# Patient Record
Sex: Male | Born: 1985
Health system: Southern US, Community
[De-identification: ages and names within clinical notes are randomized; demographics above are authoritative.]

## PROBLEM LIST (undated history)

## (undated) DIAGNOSIS — Q249 Congenital malformation of heart, unspecified: Secondary | ICD-10-CM

---
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
## 2020-02-06 ENCOUNTER — Other Ambulatory Visit: Payer: Self-pay

## 2020-02-06 ENCOUNTER — Emergency Department (HOSPITAL_COMMUNITY): Payer: Self-pay

## 2020-02-06 ENCOUNTER — Inpatient Hospital Stay (HOSPITAL_COMMUNITY)
Admission: EM | Admit: 2020-02-06 | Discharge: 2020-02-18 | DRG: 306 | Disposition: A | Discharge status: Home / Self Care | Payer: Self-pay | Attending: Internal Medicine | Admitting: Internal Medicine

## 2020-02-06 ENCOUNTER — Encounter (HOSPITAL_COMMUNITY): Payer: Self-pay | Admitting: Radiology

## 2020-02-06 DIAGNOSIS — R197 Diarrhea, unspecified: Secondary | ICD-10-CM | POA: Diagnosis present

## 2020-02-06 DIAGNOSIS — R748 Abnormal levels of other serum enzymes: Secondary | ICD-10-CM

## 2020-02-06 DIAGNOSIS — R040 Epistaxis: Secondary | ICD-10-CM | POA: Diagnosis not present

## 2020-02-06 DIAGNOSIS — R109 Unspecified abdominal pain: Secondary | ICD-10-CM | POA: Diagnosis present

## 2020-02-06 DIAGNOSIS — E46 Unspecified protein-calorie malnutrition: Secondary | ICD-10-CM | POA: Diagnosis present

## 2020-02-06 DIAGNOSIS — E43 Unspecified severe protein-calorie malnutrition: Secondary | ICD-10-CM | POA: Insufficient documentation

## 2020-02-06 DIAGNOSIS — N179 Acute kidney failure, unspecified: Secondary | ICD-10-CM | POA: Diagnosis present

## 2020-02-06 DIAGNOSIS — I5082 Biventricular heart failure: Secondary | ICD-10-CM | POA: Diagnosis present

## 2020-02-06 DIAGNOSIS — Z7901 Long term (current) use of anticoagulants: Secondary | ICD-10-CM

## 2020-02-06 DIAGNOSIS — M79622 Pain in left upper arm: Secondary | ICD-10-CM | POA: Diagnosis present

## 2020-02-06 DIAGNOSIS — I362 Nonrheumatic tricuspid (valve) stenosis with insufficiency: Secondary | ICD-10-CM

## 2020-02-06 DIAGNOSIS — E871 Hypo-osmolality and hyponatremia: Secondary | ICD-10-CM | POA: Diagnosis not present

## 2020-02-06 DIAGNOSIS — R031 Nonspecific low blood-pressure reading: Secondary | ICD-10-CM | POA: Diagnosis present

## 2020-02-06 DIAGNOSIS — K7200 Acute and subacute hepatic failure without coma: Secondary | ICD-10-CM | POA: Diagnosis present

## 2020-02-06 DIAGNOSIS — I441 Atrioventricular block, second degree: Secondary | ICD-10-CM | POA: Diagnosis present

## 2020-02-06 DIAGNOSIS — R059 Cough, unspecified: Secondary | ICD-10-CM

## 2020-02-06 DIAGNOSIS — R160 Hepatomegaly, not elsewhere classified: Secondary | ICD-10-CM | POA: Diagnosis present

## 2020-02-06 DIAGNOSIS — I4891 Unspecified atrial fibrillation: Secondary | ICD-10-CM | POA: Diagnosis present

## 2020-02-06 DIAGNOSIS — K761 Chronic passive congestion of liver: Secondary | ICD-10-CM | POA: Diagnosis present

## 2020-02-06 DIAGNOSIS — Q225 Ebstein's anomaly: Principal | ICD-10-CM

## 2020-02-06 DIAGNOSIS — Z452 Encounter for adjustment and management of vascular access device: Secondary | ICD-10-CM

## 2020-02-06 DIAGNOSIS — F17200 Nicotine dependence, unspecified, uncomplicated: Secondary | ICD-10-CM | POA: Diagnosis present

## 2020-02-06 DIAGNOSIS — R188 Other ascites: Secondary | ICD-10-CM | POA: Diagnosis present

## 2020-02-06 DIAGNOSIS — Z20822 Contact with and (suspected) exposure to covid-19: Secondary | ICD-10-CM | POA: Diagnosis present

## 2020-02-06 DIAGNOSIS — D649 Anemia, unspecified: Secondary | ICD-10-CM | POA: Diagnosis present

## 2020-02-06 DIAGNOSIS — Z79899 Other long term (current) drug therapy: Secondary | ICD-10-CM

## 2020-02-06 DIAGNOSIS — I50812 Chronic right heart failure: Secondary | ICD-10-CM | POA: Diagnosis present

## 2020-02-06 DIAGNOSIS — I4892 Unspecified atrial flutter: Secondary | ICD-10-CM | POA: Diagnosis present

## 2020-02-06 DIAGNOSIS — I50810 Right heart failure, unspecified: Secondary | ICD-10-CM | POA: Diagnosis present

## 2020-02-06 DIAGNOSIS — F329 Major depressive disorder, single episode, unspecified: Secondary | ICD-10-CM | POA: Diagnosis present

## 2020-02-06 DIAGNOSIS — G4700 Insomnia, unspecified: Secondary | ICD-10-CM | POA: Diagnosis present

## 2020-02-06 DIAGNOSIS — K760 Fatty (change of) liver, not elsewhere classified: Secondary | ICD-10-CM | POA: Diagnosis present

## 2020-02-06 DIAGNOSIS — I50811 Acute right heart failure: Secondary | ICD-10-CM | POA: Diagnosis present

## 2020-02-06 DIAGNOSIS — E162 Hypoglycemia, unspecified: Secondary | ICD-10-CM | POA: Diagnosis not present

## 2020-02-06 DIAGNOSIS — K219 Gastro-esophageal reflux disease without esophagitis: Secondary | ICD-10-CM | POA: Diagnosis present

## 2020-02-06 DIAGNOSIS — I809 Phlebitis and thrombophlebitis of unspecified site: Secondary | ICD-10-CM | POA: Diagnosis present

## 2020-02-06 DIAGNOSIS — D696 Thrombocytopenia, unspecified: Secondary | ICD-10-CM | POA: Diagnosis present

## 2020-02-06 DIAGNOSIS — I471 Supraventricular tachycardia, unspecified: Secondary | ICD-10-CM | POA: Diagnosis present

## 2020-02-06 DIAGNOSIS — E860 Dehydration: Secondary | ICD-10-CM | POA: Diagnosis present

## 2020-02-06 DIAGNOSIS — Z9119 Patient's noncompliance with other medical treatment and regimen: Secondary | ICD-10-CM

## 2020-02-06 DIAGNOSIS — I50813 Acute on chronic right heart failure: Secondary | ICD-10-CM

## 2020-02-06 DIAGNOSIS — R7989 Other specified abnormal findings of blood chemistry: Secondary | ICD-10-CM | POA: Diagnosis present

## 2020-02-06 DIAGNOSIS — R1011 Right upper quadrant pain: Secondary | ICD-10-CM

## 2020-02-06 DIAGNOSIS — E872 Acidosis: Secondary | ICD-10-CM | POA: Diagnosis present

## 2020-02-06 DIAGNOSIS — E876 Hypokalemia: Secondary | ICD-10-CM | POA: Diagnosis present

## 2020-02-06 DIAGNOSIS — K819 Cholecystitis, unspecified: Secondary | ICD-10-CM | POA: Diagnosis present

## 2020-02-06 DIAGNOSIS — I484 Atypical atrial flutter: Secondary | ICD-10-CM

## 2020-02-06 DIAGNOSIS — Z681 Body mass index (BMI) 19 or less, adult: Secondary | ICD-10-CM

## 2020-02-06 DIAGNOSIS — F17210 Nicotine dependence, cigarettes, uncomplicated: Secondary | ICD-10-CM | POA: Diagnosis present

## 2020-02-06 DIAGNOSIS — R570 Cardiogenic shock: Secondary | ICD-10-CM | POA: Diagnosis present

## 2020-02-06 HISTORY — DX: Congenital malformation of heart, unspecified: Q24.9

## 2020-02-06 LAB — COMPREHENSIVE METABOLIC PANEL
ALT: 566 U/L — ABNORMAL HIGH (ref 0–44)
AST: 801 U/L — ABNORMAL HIGH (ref 15–41)
Albumin: 3.7 g/dL (ref 3.5–5.0)
Alkaline Phosphatase: 54 U/L (ref 38–126)
Anion gap: 13 (ref 5–15)
BUN: 12 mg/dL (ref 6–20)
CO2: 24 mmol/L (ref 22–32)
Calcium: 9 mg/dL (ref 8.9–10.3)
Chloride: 96 mmol/L — ABNORMAL LOW (ref 98–111)
Creatinine, Ser: 0.93 mg/dL (ref 0.61–1.24)
GFR calc Af Amer: >60 mL/min (ref >60)
GFR calc non Af Amer: >60 mL/min (ref >60)
Glucose, Bld: 113 mg/dL — ABNORMAL HIGH (ref 70–99)
Potassium: 4.2 mmol/L (ref 3.5–5.1)
Sodium: 133 mmol/L — ABNORMAL LOW (ref 135–145)
Total Bilirubin: 1.7 mg/dL — ABNORMAL HIGH (ref 0.3–1.2)
Total Protein: 7.3 g/dL (ref 6.5–8.1)

## 2020-02-06 LAB — URINALYSIS, ROUTINE W REFLEX MICROSCOPIC
Glucose, UA: NEGATIVE mg/dL
Hgb urine dipstick: NEGATIVE
Ketones, ur: NEGATIVE mg/dL
Leukocytes,Ua: NEGATIVE
Nitrite: NEGATIVE
Protein, ur: 100 mg/dL — AB
Specific Gravity, Urine: 1.01 (ref 1.005–1.030)
pH: 5.5 (ref 5.0–8.0)

## 2020-02-06 LAB — CBC WITH DIFFERENTIAL/PLATELET
Abs Immature Granulocytes: 0.02 10*3/uL (ref 0.00–0.07)
Basophils Absolute: 0 10*3/uL (ref 0.0–0.1)
Basophils Relative: 0 %
Eosinophils Absolute: 0 10*3/uL (ref 0.0–0.5)
Eosinophils Relative: 0 %
HCT: 38.7 % — ABNORMAL LOW (ref 39.0–52.0)
Hemoglobin: 12.7 g/dL — ABNORMAL LOW (ref 13.0–17.0)
Immature Granulocytes: 0 %
Lymphocytes Relative: 14 %
Lymphs Abs: 0.8 10*3/uL (ref 0.7–4.0)
MCH: 29.5 pg (ref 26.0–34.0)
MCHC: 32.8 g/dL (ref 30.0–36.0)
MCV: 90 fL (ref 80.0–100.0)
Monocytes Absolute: 0.8 10*3/uL (ref 0.1–1.0)
Monocytes Relative: 13 %
Neutro Abs: 4.3 10*3/uL (ref 1.7–7.7)
Neutrophils Relative %: 73 %
Platelets: 218 10*3/uL (ref 150–400)
RBC: 4.3 MIL/uL (ref 4.22–5.81)
RDW: 15.8 % — ABNORMAL HIGH (ref 11.5–15.5)
WBC: 5.9 10*3/uL (ref 4.0–10.5)
nRBC: 0 % (ref 0.0–0.2)

## 2020-02-06 LAB — SARS CORONAVIRUS 2 BY RT PCR (HOSPITAL ORDER, PERFORMED IN ~~LOC~~ HOSPITAL LAB): SARS Coronavirus 2: NEGATIVE

## 2020-02-06 LAB — LIPASE, BLOOD: Lipase: 29 U/L (ref 11–51)

## 2020-02-06 LAB — ACETAMINOPHEN LEVEL: Acetaminophen (Tylenol), Serum: <10 ug/mL — ABNORMAL LOW (ref 10–30)

## 2020-02-06 LAB — HEPATITIS PANEL, ACUTE
HCV Ab: NONREACTIVE
Hep A IgM: NONREACTIVE
Hep B C IgM: NONREACTIVE
Hepatitis B Surface Ag: NONREACTIVE

## 2020-02-06 LAB — URINALYSIS, MICROSCOPIC (REFLEX): Bacteria, UA: NONE SEEN

## 2020-02-06 MED ORDER — NICOTINE 14 MG/24HR TD PT24
14.0000 mg | MEDICATED_PATCH | Freq: Every day | TRANSDERMAL | Status: DC
  Administered 2020-02-07 – 2020-02-18 (×12): 14 mg via TRANSDERMAL
  Filled 2020-02-06 (×13): qty 1

## 2020-02-06 MED ORDER — KETOROLAC TROMETHAMINE 30 MG/ML IJ SOLN
30.0000 mg | Freq: Four times a day (QID) | INTRAMUSCULAR | Status: DC
  Administered 2020-02-06 – 2020-02-07 (×3): 30 mg via INTRAVENOUS
  Filled 2020-02-06 (×3): qty 1

## 2020-02-06 MED ORDER — METOPROLOL TARTRATE 5 MG/5ML IV SOLN
2.5000 mg | INTRAVENOUS | Status: DC | PRN
  Administered 2020-02-06 (×8): 2.5 mg via INTRAVENOUS
  Filled 2020-02-06 (×4): qty 5

## 2020-02-06 MED ORDER — LACTATED RINGERS IV SOLN: INTRAVENOUS | Status: DC

## 2020-02-06 MED ORDER — ADENOSINE 6 MG/2ML IV SOLN
INTRAVENOUS | Status: AC
  Administered 2020-02-06: 12 mg
  Filled 2020-02-06: qty 2

## 2020-02-06 MED ORDER — METOPROLOL TARTRATE 5 MG/5ML IV SOLN
5.0000 mg | Freq: Once | INTRAVENOUS | Status: AC
  Administered 2020-02-06: 5 mg via INTRAVENOUS
  Filled 2020-02-06: qty 5

## 2020-02-06 MED ORDER — DIGOXIN 0.25 MG/ML IJ SOLN
0.2500 mg | Freq: Every day | INTRAMUSCULAR | Status: AC
  Administered 2020-02-07: 0.25 mg via INTRAVENOUS
  Filled 2020-02-06: qty 1

## 2020-02-06 MED ORDER — FUROSEMIDE 10 MG/ML IJ SOLN
20.0000 mg | Freq: Two times a day (BID) | INTRAMUSCULAR | Status: DC
  Administered 2020-02-06: 20 mg via INTRAVENOUS
  Filled 2020-02-06: qty 4

## 2020-02-06 MED ORDER — DILTIAZEM HCL-DEXTROSE 125-5 MG/125ML-% IV SOLN (PREMIX)
5.0000 mg/h | INTRAVENOUS | Status: DC
  Filled 2020-02-06: qty 125

## 2020-02-06 MED ORDER — ACETAMINOPHEN 325 MG PO TABS: 650.0000 mg | ORAL_TABLET | Freq: Four times a day (QID) | ORAL | Status: DC | PRN

## 2020-02-06 MED ORDER — METOPROLOL TARTRATE 25 MG PO TABS
12.5000 mg | ORAL_TABLET | Freq: Two times a day (BID) | ORAL | Status: DC
  Administered 2020-02-06 (×2): 12.5 mg via ORAL
  Filled 2020-02-06 (×2): qty 1

## 2020-02-06 MED ORDER — SODIUM CHLORIDE 0.9 % IV SOLN: INTRAVENOUS | Status: DC

## 2020-02-06 MED ORDER — HYDROMORPHONE HCL 1 MG/ML IJ SOLN
1.0000 mg | Freq: Once | INTRAMUSCULAR | Status: AC
  Administered 2020-02-06: 1 mg via INTRAVENOUS
  Filled 2020-02-06: qty 1

## 2020-02-06 MED ORDER — MORPHINE SULFATE (PF) 4 MG/ML IV SOLN: 4.0000 mg | Freq: Once | INTRAVENOUS | Status: DC

## 2020-02-06 MED ORDER — FUROSEMIDE 10 MG/ML IJ SOLN
40.0000 mg | Freq: Once | INTRAMUSCULAR | Status: AC
  Administered 2020-02-06: 40 mg via INTRAVENOUS
  Filled 2020-02-06: qty 4

## 2020-02-06 MED ORDER — ENOXAPARIN SODIUM 40 MG/0.4ML ~~LOC~~ SOLN
40.0000 mg | SUBCUTANEOUS | Status: DC
  Administered 2020-02-06: 40 mg via SUBCUTANEOUS
  Filled 2020-02-06: qty 0.4

## 2020-02-06 MED ORDER — SODIUM CHLORIDE 0.9 % IV BOLUS
1000.0000 mL | Freq: Once | INTRAVENOUS | Status: AC
  Administered 2020-02-06: 1000 mL via INTRAVENOUS

## 2020-02-06 MED ORDER — ONDANSETRON 4 MG PO TBDP
4.0000 mg | ORAL_TABLET | Freq: Four times a day (QID) | ORAL | Status: DC | PRN
  Filled 2020-02-06: qty 1

## 2020-02-06 MED ORDER — MORPHINE SULFATE (PF) 2 MG/ML IV SOLN
2.0000 mg | INTRAVENOUS | Status: DC | PRN
  Administered 2020-02-06 – 2020-02-11 (×2): 2 mg via INTRAVENOUS
  Filled 2020-02-06 (×2): qty 1

## 2020-02-06 MED ORDER — KETOROLAC TROMETHAMINE 30 MG/ML IJ SOLN: 30.0000 mg | Freq: Four times a day (QID) | INTRAMUSCULAR | Status: DC | PRN

## 2020-02-06 MED ORDER — ACETAMINOPHEN 650 MG RE SUPP: 650.0000 mg | Freq: Four times a day (QID) | RECTAL | Status: DC | PRN

## 2020-02-06 MED ORDER — SODIUM CHLORIDE 0.9 % IV SOLN
2.0000 g | INTRAVENOUS | Status: DC
  Administered 2020-02-07: 2 g via INTRAVENOUS
  Filled 2020-02-06: qty 20

## 2020-02-06 MED ORDER — FENTANYL CITRATE (PF) 100 MCG/2ML IJ SOLN
50.0000 ug | Freq: Once | INTRAMUSCULAR | Status: AC
  Administered 2020-02-06: 50 ug via INTRAVENOUS
  Filled 2020-02-06: qty 2

## 2020-02-06 MED ORDER — ONDANSETRON HCL 4 MG/2ML IJ SOLN
4.0000 mg | Freq: Four times a day (QID) | INTRAMUSCULAR | Status: DC | PRN
  Administered 2020-02-07: 4 mg via INTRAVENOUS
  Filled 2020-02-06 (×2): qty 2

## 2020-02-06 MED ORDER — SODIUM CHLORIDE 0.9 % IV SOLN
2.0000 g | Freq: Once | INTRAVENOUS | Status: AC
  Administered 2020-02-06: 2 g via INTRAVENOUS
  Filled 2020-02-06: qty 20

## 2020-02-06 MED ORDER — METOPROLOL TARTRATE 5 MG/5ML IV SOLN
5.0000 mg | Freq: Once | INTRAVENOUS | Status: AC
  Administered 2020-02-06: 2.5 mg via INTRAVENOUS
  Filled 2020-02-06: qty 5

## 2020-02-06 MED ORDER — ADENOSINE 6 MG/2ML IV SOLN
INTRAVENOUS | Status: AC
  Administered 2020-02-06: 6 mg
  Filled 2020-02-06: qty 2

## 2020-02-06 MED ORDER — IOHEXOL 300 MG/ML  SOLN
100.0000 mL | Freq: Once | INTRAMUSCULAR | Status: AC | PRN
  Administered 2020-02-06: 100 mL via INTRAVENOUS

## 2020-02-06 NOTE — ED Provider Notes (Signed)
[facility]-EMERGENCY DEPT Provider Note   CSN: [id number] Arrival date & time: 02/06/20  0944     History Chief Complaint  Patient presents with  . Abdominal Pain  . Emesis  . Tachycardia    Olaf Mesa is a 34 y.o. male.  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  States it started today and has also been associate with some hematuria.  He has had some nausea vomiting.  Denies any fever or chills.  Has had intermittent palpitations.  In the past he had been on digoxin as well as a beta-blocker but does not take any medications currently.  Endorses dizziness but denies any syncope or near syncope.        No past medical history on file.  There are no problems to display for this patient.        No family history on file.  Social History   Tobacco Use  . Smoking status: Not on file  Substance Use Topics  . Alcohol use: Not on file  . Drug use: Not on file    Home Medications Prior to Admission medications   Not on File    Allergies    Patient has no known allergies.  Review of Systems   Review of Systems  All other systems reviewed and are negative.   Physical Exam Updated Vital Signs BP 98/71   Pulse (!) 175   Temp 99.8 F (37.7 C) (Oral)   Resp 17   Ht 1.829 m (6')   Wt 69.5 kg   SpO2 96%   BMI 20.77 kg/m   Physical Exam Vitals and nursing note reviewed.  Constitutional:      General: He is not in acute distress.    Appearance: Normal appearance. He is well-developed. He is not toxic-appearing.  HENT:     Head: Normocephalic and atraumatic.  Eyes:     General: Lids are normal.     Conjunctiva/sclera: Conjunctivae normal.     Pupils: Pupils are equal, round, and reactive to light.  Neck:     Thyroid: No thyroid mass.     Trachea: No tracheal deviation.  Cardiovascular:     Rate and Rhythm: Regular  rhythm. Tachycardia present.     Heart sounds: Normal heart sounds. No murmur heard.  No gallop.   Pulmonary:     Effort: Pulmonary effort is normal. No respiratory distress.     Breath sounds: Normal breath sounds. No stridor. No decreased breath sounds, wheezing, rhonchi or rales.  Abdominal:     General: Bowel sounds are normal. There is no distension.     Palpations: Abdomen is soft.     Tenderness: There is abdominal tenderness in the right upper quadrant. There is guarding. There is no rebound.    Musculoskeletal:        General: No tenderness. Normal range of motion.     Cervical back: Normal range of motion and neck supple.  Skin:    General: Skin is warm and dry.     Findings: No abrasion or rash.  Neurological:     Mental Status: He is alert and oriented to person, place, and time.     GCS: GCS eye subscore is 4. GCS verbal subscore is 5. GCS motor subscore is 6.     Cranial Nerves: No cranial nerve deficit.     Sensory: No sensory deficit.  Psychiatric:  Speech: Speech normal.        Behavior: Behavior normal.     ED Results / Procedures / Treatments   Labs (all labs ordered are listed, but only abnormal results are displayed) Labs Reviewed  URINE CULTURE  SARS CORONAVIRUS 2 BY RT PCR (HOSPITAL ORDER, PERFORMED IN [facility])  CBC WITH DIFFERENTIAL/PLATELET  COMPREHENSIVE METABOLIC PANEL  LIPASE, BLOOD  URINALYSIS, ROUTINE W REFLEX MICROSCOPIC    EKG EKG Interpretation  Date/Time:  Tuesday February 06 2020 10:00:16 EDT Ventricular Rate:  177 PR Interval:    QRS Duration: 106 QT Interval:  313 QTC Calculation: 538 R Axis:   -74 Text Interpretation: Sinus tachycardia Incomplete RBBB and LAFB Anterolateral infarct, age indeterminate Abnormal T, consider ischemia, lateral leads Prolonged QT interval 12 Lead; Mason-Likar No old tracing to compare Confirmed by Lorre Nick ([id number]) on 02/06/2020 10:03:52 AM   Radiology No results  found.  Procedures Procedures (including critical care time)  Medications Ordered in ED Medications  0.9 %  sodium chloride infusion (has no administration in time range)  adenosine (ADENOCARD) 6 MG/2ML injection (6 mg  Given 02/06/20 1018)  sodium chloride 0.9 % bolus 1,000 mL (1,000 mLs Intravenous New Bag/Given 02/06/20 1018)  adenosine (ADENOCARD) 6 MG/2ML injection (12 mg  Given 02/06/20 1027)  adenosine (ADENOCARD) 6 MG/2ML injection (6 mg  Given 02/06/20 1027)    ED Course  I have reviewed the triage vital signs and the nursing notes.  Pertinent labs & imaging results that were available during my care of the patient were reviewed by me and considered in my medical decision making (see chart for details).    MDM Rules/Calculators/A&P                          Patient presented and likely to be SVT and was given adenosine x3 without significant improvement.  This is subsequently followed by Lopressor 5 mg which improved the patient's heart rate.  I did discuss the case with cardiology and they came and saw the patient left consult note.  Patient medicated for his abdominal discomfort.  Labs were significant for a normal white count but elevated transaminases as well as bili.  Patient hepatitis panel sent.  Had a CT scan and subsequent ultrasound which is concerning for a calculus cholecystitis.  Case discussed with general surgery who recommends IV antibiotics and Rocephin was started.  Recommends admission to medicine service and they will order a HIDA scan and follow along.  CRITICAL CARE Performed by: Toy Baker Total critical care time: 75 minutes Critical care time was exclusive of separately billable procedures and treating other patients. Critical care was necessary to treat or prevent imminent or life-threatening deterioration. Critical care was time spent personally by me on the following activities: development of treatment plan with patient and/or surrogate as well as  nursing, discussions with consultants, evaluation of patient's response to treatment, examination of patient, obtaining history from patient or surrogate, ordering and performing treatments and interventions, ordering and review of laboratory studies, ordering and review of radiographic studies, pulse oximetry and re-evaluation of patient's condition.  Final Clinical Impression(s) / ED Diagnoses Final diagnoses:  None    Rx / DC Orders ED Discharge Orders    None       Lorre Nick, MD 02/06/20 [phone]

## 2020-02-06 NOTE — ED Notes (Signed)
Provider notified of Pt returned to tachycardia

## 2020-02-06 NOTE — Consult Note (Addendum)
Cardiology Consultation:   Patient ID: Perry Cook; [id number]; 17-Feb-1986   Admit date: 02/06/2020 Date of Consult: 02/06/2020  Primary Care Provider: No primary care provider on file. Primary Cardiologist: New to [facility]  Patient Profile:   Perry Cook is a 34 y.o. male with a hx of Ebstein's anomaly syndrome  With repair 2011 who is being seen today for the evaluation of SVT at the request of Dr. Zenia Resides.  History of Present Illness:   Mr. Pandit is a 34 year old male with a history stated above who presented to [facility] 02/06/2020 with severe right upper quadrant abdominal pain which began approximately 2 weeks ago however worsened earlier today.  Pain has been associated with nausea, vomiting, dizziness and hematuria. He denies recently illness with fever or chills. He has a hx of Ebstein's anomaly syndrome with repair at age 89 yo.  He reports he was initially diagnosed after collapsing during football practice.  He states he was previously treated for unknown arrhythmia in the postsurgical setting with digoxin and beta-blocker.  He was only on medication for short period of time.  He has not followed with cardiology care in many years.  He denies recent chest pain, palpitations, shortness of breath, LE edema, dizziness or syncope.  He is not currently on any medications.  He continues to have severe abdominal pain during my assessment.  In the ED, EKG performed which showed sinus tachycardia with incomplete bundle with a rate at 177 bpm. He was given three doses of adenosine without conversion at which time he received two doses of metoprolol, both at 2.21m IV with conversion to NSR with rates in the 80's. Plan is for stat CT of his abdomen for further workup given severe pain as well as markedly elevated AST/ALFT levels at 801/566.  BPs are soft however stable at 90/71, 115/74, 102/71.   No past medical history on file.  The histories are not reviewed yet. Please review them  in the "History" navigator section and refresh this [place]   Prior to Admission medications   Not on File    Inpatient Medications: Scheduled Meds:  Continuous Infusions: . sodium chloride 125 mL/hr at 02/06/20 1032   PRN Meds:   Allergies:   No Known Allergies  Social History:   Social History   Socioeconomic History  . Marital status: Single    Spouse name: Not on file  . Number of children: Not on file  . Years of education: Not on file  . Highest education level: Not on file  Occupational History  . Not on file  Tobacco Use  . Smoking status: Not on file  Substance and Sexual Activity  . Alcohol use: Not on file  . Drug use: Not on file  . Sexual activity: Not on file  Other Topics Concern  . Not on file  Social History Narrative  . Not on file   Social Determinants of Health   Financial Resource Strain:   . Difficulty of Paying Living Expenses: Not on file  Food Insecurity:   . Worried About RCharity fundraiserin the Last Year: Not on file  . Ran Out of Food in the Last Year: Not on file  Transportation Needs:   . Lack of Transportation (Medical): Not on file  . Lack of Transportation (Non-Medical): Not on file  Physical Activity:   . Days of Exercise per Week: Not on file  . Minutes of Exercise per Session: Not on file  Stress:   .  Feeling of Stress : Not on file  Social Connections:   . Frequency of Communication with Friends and Family: Not on file  . Frequency of Social Gatherings with Friends and Family: Not on file  . Attends Religious Services: Not on file  . Active Member of Clubs or Organizations: Not on file  . Attends Archivist Meetings: Not on file  . Marital Status: Not on file  Intimate Partner Violence:   . Fear of Current or Ex-Partner: Not on file  . Emotionally Abused: Not on file  . Physically Abused: Not on file  . Sexually Abused: Not on file    Family History:   No family history on file. Family Status:    No family status information on file.    ROS:  Please see the history of present illness.  All other ROS reviewed and negative.     Physical Exam/Data:   Vitals:   02/06/20 1015 02/06/20 1029 02/06/20 1030 02/06/20 1042  BP: 105/79 98/71 102/71 115/74  Pulse: (!) 177 (!) 175 (!) 175   Resp: (!) _0 Temp:      TempSrc:      SpO2: 96% 96% 96%   Weight:      Height:       No intake or output data in the 24 hours ending 02/06/20 1050 Filed Weights   02/06/20 0949  Weight: 69.5 kg   Body mass index is 20.77 kg/m.   General: Ill-appearing, moderate distress Neck: Negative for carotid bruits. No JVD Lungs:Clear to ausculation bilaterally. No wheezes, rales, or rhonchi. Breathing is unlabored. Cardiovascular: RRR with S1 S2. + murmur Abdomen: Soft, tender, non-distended. No obvious abdominal masses. Extremities: No edema. Radial pulses 2+ bilaterally Neuro: Alert and oriented. No focal deficits. No facial asymmetry. MAE spontaneously. Psych: Responds to questions appropriately with normal affect.    EKG:  The EKG was personally reviewed and demonstrates: 02/05/20 sinus tachycardia with ventricular bigeminy at a rate of 123 bpm with evidence of block. Telemetry:  Telemetry was personally reviewed and demonstrates: 02/06/20 NSR with rates in the 80's   Relevant CV Studies:  None   Laboratory Data:  ChemistryNo results for input(s): NA, K, CL, CO2, GLUCOSE, BUN, CREATININE, CALCIUM, GFRNONAA, GFRAA, ANIONGAP in the last 168 hours.  No results found for: PROT, ALBUMIN, AST, ALT, ALKPHOS, BILITOT Hematology Recent Labs  Lab 02/06/20 1025  WBC 5.9  RBC 4.30  HGB 12.7*  HCT 38.7*  MCV 90.0  MCH 29.5  MCHC 32.8  RDW 15.8*  PLT 218   Cardiac EnzymesNo results for input(s): TROPONINI in the last 168 hours. No results for input(s): TROPIPOC in the last 168 hours.  BNPNo results for input(s): BNP, PROBNP in the last 168 hours.  DDimer No results for input(s):  DDIMER in the last 168 hours. TSH: No results found for: TSH Lipids:No results found for: CHOL, HDL, LDLCALC, LDLDIRECT, TRIG, CHOLHDL HgbA1c:No results found for: HGBA1C  Radiology/Studies:  No results found.  Assessment and Plan:   1. 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  Pain has been associated with nausea, vomiting, dizziness and hematuria.  -hx of Ebstein's anomaly syndrome with repair at age 34 yo>>initially diagnosed after collapsing during football practice. He states he was previously treated for unknown arrhythmia in the postsurgical setting with digoxin and beta-blocker.  -Not  followed with cardiology regularly in many years.   -EKG performed which showed sinus tachycardia with incomplete bundle with a rate at 177 bpm>>>given three doses of adenosine without conversion at which time he received two doses of metoprolol, both at 2.5mg IV with conversion to NSR with rates in the 80's.  -Would recommend continuing oral beta blocker with metoprolol tartrate at 12.5mg PO BID given soft BP's. He may be able to tolerate higher doses once acute abdominal illness stabilized.  -Will obtain echocardiogram for further structural assessment  2. Abdominal pain: -Presented with acute abdominal pain which began approximately 2 weeks ago however worsened on day of presentation.  On assessment, he continues to have severe right upper quadrant pain with plans for stat CT for further workup -AST/ALFT markedly elevated at 801/566 with concern for acute cholecystitis with liver involvement Work-up per primary team  For questions or updates, please contact [facility] Please consult [email or web address] for contact info under Cardiology/STEMI.   Signed, Jill McDaniel NP-C [facility] Pager: [phone] 02/06/2020 10:50 AM   I have seen  and examined the patient along with Jill McDaniel NP-C.  I have reviewed the chart, notes and new data.  I agree with PA/NP's note.  Key new complaints: biggest complaints are abdominal - early satiety, nausea and vomiting even with fluids only, dry heaves, epigastric and RUQ and R flank tenderness. No fever or chills or hematemesis. Had pedal edema last week, resolved after he stopped eating. Unaware of palpitations even when HR was >170. Has not had syncope, but a couple of severe "weak spells" with physical activity. No chest discomfort Key examination changes: JVD to angle of jaw, clear lungs, RRR and no murmur (very noisy room though), severe tender hepatomegaly, no leg edema Key new findings / data: Initial ECG shows atrial flutter with 2:1 AV block, RBBB +LAFB; subsequent tracings show irregular rhythm due to varying degrees of AV block. Currently appears to have SR with no evidence of preexcitation (in fact seems to have a very long 1st deg AV block on telemetry); markedly elevated AST/ALT, with normal alk phos and bili and normal WBC; normal renal function. CT shows severe enlargement of right heart chambers, severe hepatomegaly with contrast reflux deep into IVC, c/w R heart failure. Some ascites. Echo pending  PLAN:  He appears to have severe right heart failure with prominent signs and symptoms of hepatic congestion. Suspect that there will be severe abnormalities of the tricuspid valve on echo. 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 He may also have severe TR with faint murmur if there is "wide-open" bidirectional laminar flow. Beta blockers for SVT prevention/rate control. No evidence of WPW so far. Diuresis slowly to avoid hypotension. Awaiting echo for additional recommendations.  Mihai Croitoru, MD, FACC [facility] [phone] 02/06/2020, 1:44 PM    

## 2020-02-06 NOTE — H&P (Signed)
History and Physical    Perry Cook NWG:[id number] DOB: [birth year] DOA: 02/06/2020  PCP: Patient, No Pcp Per Consultants:  None Patient coming from: Home - lives with roommate; NOK: none  Chief Complaint: abdominal pain  HPI: Perry Cook is a 34 y.o. male with medical history significant of Epstein's anomaly presenting with abdominal pain.  He reports that he developed pain in his abdomen and RUQ about 1 month ago.   He has intermittent diarrhea, with 0-3 loose stools per day.  +N/V but more retching and post-tussive heaves than actual emesis since he has been eating very little.  +weight loss for the last month.  +SOB when at work.  LE edema x 1 episode, none currently.  He has noticed his heart racing up to 170 bpm, primarily at work or when he is trying to sleep.   ED Course: ?acalculus cholecystitis.  Developed afib vs. SVT.  Dilt didn't work, beta blocker did.  Cards has seen.  Surgery requests [facility] admission, needs HIDA scan.  Given Rocephin.  Review of Systems: As per HPI; otherwise review of systems reviewed and negative.   Ambulatory Status: Ambulates without assistance  COVID Vaccine Status:  None  Past Medical History:  Diagnosis Date  . Congenital heart disease    Epstein anomaly    Past Surgical History:  Procedure Laterality Date  . Epstein anomaly repair  06/2000    Social History   Socioeconomic History  . Marital status: Single    Spouse name: Not on file  . Number of children: Not on file  . Years of education: Not on file  . Highest education level: Not on file  Occupational History  . Occupation: [employer]  Tobacco Use  . Smoking status: Current Every Day Smoker    Packs/day: 1.00    Start date: 2005  . Smokeless tobacco: Never Used  Substance and Sexual Activity  . 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 Drug use: Not Currently     Comment: denies  . Sexual activity: Not on file  Other Topics Concern  . Not on file  Social History Narrative  . Not on file   Social Determinants of Health   Financial Resource Strain:   . Difficulty of Paying Living Expenses: Not on file  Food Insecurity:   . Worried About Programme researcher, broadcasting/film/video in the Last Year: Not on file  . Ran Out of Food in the Last Year: Not on file  Transportation Needs:   . Lack of Transportation (Medical): Not on file  . Lack of Transportation (Non-Medical): Not on file  Physical Activity:   . Days of Exercise per Week: Not on file  . Minutes of Exercise per Session: Not on file  Stress:   . Feeling of Stress : Not on file  Social Connections:   . Frequency of Communication with Friends and Family: Not on file  . Frequency of Social Gatherings with Friends and Family: Not on file  . Attends Religious Services: Not on file  . Active Member of Clubs or Organizations: Not on file  . Attends Banker Meetings: Not on file  . Marital Status: Not on file  Intimate Partner Violence:   . Fear of Current or Ex-Partner: Not on file  . Emotionally Abused: Not on file  . Physically Abused: Not on file  . Sexually Abused: Not  on file    No Known Allergies  History reviewed. No pertinent family history.  Prior to Admission medications   Medication Sig Start Date End Date Taking? Authorizing Provider  Pseudoeph-Doxylamine-DM-APAP (NYQUIL PO) Take 1 tablet by mouth at bedtime as needed (cold symptoms).   Yes [provider]    Physical Exam: Vitals:   02/06/20 1654 02/06/20 1656 02/06/20 1659 02/06/20 1700  BP:    (!) 130/118  Pulse:  85  88  Resp: 16 17 11  (!) 23  Temp:      TempSrc:      SpO2:  91%  93%  Weight:      Height:         . General:  Appears calm and comfortable and is NAD . Eyes:  PERRL, EOMI, normal lids, iris . ENT:  grossly normal hearing, lips & tongue, mmm; appropriate dentition; large gauges in his  pinna . Neck:  no LAD, masses or thyromegaly . Cardiovascular:  Extreme tachycardia to 170 bpm. No LE edema.  Respiratory:   CTA bilaterally with no wheezes/rales/rhonchi.  Normal respiratory effort. . Abdomen:  soft, RUQ TTP, ND, NABS . Skin:  no rash or induration seen on limited exam . Musculoskeletal:  grossly normal tone BUE/BLE, good ROM, no bony abnormality . Psychiatric:  blunted mood and affect, speech fluent and appropriate, AOx3 . Neurologic:  CN 2-12 grossly intact, moves all extremities in coordinated fashion    Radiological Exams on Admission: DG Chest 2 View  Result Date: 02/06/2020 CLINICAL DATA:  Abdominal pain, tachycardia, congenital heart disease EXAM: CHEST - 2 VIEW COMPARISON:  None. FINDINGS: Lungs are clear. No pneumothorax or pleural effusion. There is moderate global cardiomegaly with particular enlargement of the right atrium. Median sternotomy has been performed. Widening of the right paratracheal stripe may relate to vascular shadow, particularly if there is history of prior shunt formation or inferior vena cava interruption. Lymphadenopathy may appear similarly. The pulmonary vascularity is normal. No acute bone abnormality. IMPRESSION: Moderate global cardiomegaly. Widening of the right paratracheal stripe, vascular shadow versus lymphadenopathy. This could be better assessed with CT imaging if prior examinations are unavailable for comparison. No radiographic evidence of acute cardiopulmonary disease. Electronically Signed   By: 02/08/2020 MD   On: 02/06/2020 15:20   CT Abdomen Pelvis W Contrast  Result Date: 02/06/2020 CLINICAL DATA:  34 year old male with history of abdominal distension. History of Ebstein's anomaly. EXAM: CT ABDOMEN AND PELVIS WITH CONTRAST TECHNIQUE: Multidetector CT imaging of the abdomen and pelvis was performed using the standard protocol following bolus administration of intravenous contrast. CONTRAST:  20 OMNIPAQUE IOHEXOL 300  MG/ML  SOLN COMPARISON:  No priors. FINDINGS: Lower chest: Severe cardiomegaly with massively dilated right atrium. Hepatobiliary: Liver is enlarged measuring up to 19.8 cm in craniocaudal span. Diffuse low attenuation throughout the hepatic parenchyma, indicative of hepatic steatosis. Reflux of contrast material from the inferior vena cava through the hepatic veins, indicative of significant passive hepatic congestion. No discrete cystic or solid hepatic lesions. No intra or extrahepatic biliary ductal dilatation. Gallbladder is unremarkable in appearance. Pancreas: No pancreatic mass. No pancreatic ductal dilatation. No pancreatic or peripancreatic fluid collections or inflammatory changes. Spleen: Unremarkable. Adrenals/Urinary Tract: Bilateral kidneys and adrenal glands are normal in appearance. No hydroureteronephrosis. Urinary bladder is nearly completely decompressed, and otherwise unremarkable in appearance. Stomach/Bowel: Normal appearance of the stomach. No pathologic dilatation of small bowel or colon. Normal appendix. Vascular/Lymphatic: No significant atherosclerotic disease, aneurysm or dissection noted in  the abdominal or pelvic vasculature. No lymphadenopathy noted in the abdomen or pelvis. Reproductive: Prostate gland and seminal vesicles are unremarkable in appearance. Other: Small volume of ascites.  No pneumoperitoneum. Musculoskeletal: There are no aggressive appearing lytic or blastic lesions noted in the visualized portions of the skeleton. IMPRESSION: 1. Cardiomegaly with severe right atrial dilatation and reflux of contrast material into the liver with hepatomegaly, likely secondary to passive hepatic congestion. There is also evidence of hepatic steatosis and a small volume of ascites. 2. No other acute findings are noted in the abdomen or pelvis. Electronically Signed   By: Trudie Reedaniel  Entrikin M.D.   On: 02/06/2020 12:57   US Abdomen Limited  Result Date: 02/06/2020 CLINICAL DATA:  Right  upper quadrant pain EXAM: ULTRASOUND ABDOMEN LIMITED RIGHT UPPER QUADRANT COMPARISON:  CT abdomen and pelvis February 06, 2020 FINDINGS: Gallbladder: No gallstones are appreciable. The gallbladder wall is diffusely thickened and edematous with mild pericholecystic fluid. No sonographic Murphy sign noted by sonographer. Common bile duct: Diameter: 3 mm. Liver: No focal lesion identified. Within normal limits in parenchymal echogenicity. Portal vein is patent on color Doppler imaging with apparent bidirectional flow within the portal vein. Other: There is mild ascites. IMPRESSION: 1. No gallstones evident. Gallbladder wall thickening with edema and pericholecystic fluid. While ascites may cause gallbladder wall thickening, the overall appearance of the gallbladder is concerning for acalculus cholecystitis. In this regard, it may be reasonable to consider nuclear medicine hepatobiliary imaging study to assess for cystic duct patency. 2.  Mild ascites. 3. Bidirectional flow in the portal vein. Portal vein appears patent. Electronically Signed   By: Bretta BangWilliam  Woodruff III M.D.   On: 02/06/2020 14:27    EKG: Independently reviewed.   1000 - Afib vs. SVT with rate 177; prolonged QTc 538 1038 - Afib vs. SVT with rate 169; prolonged QTc 545 1047 - Afib with rate 123; nonspecific ST changes with no evidence of acute ischemia   Labs on Admission: I have personally reviewed the available labs and imaging studies at the time of the admission.  Pertinent labs:   AST 801/ALT 566/Bili 1.7 WBC 5.9 Hgb 12.7 APAP <10 COVID negative   Assessment/Plan Principal Problem:   Right heart failure (HCC) Active Problems:   SVT (supraventricular tachycardia) (HCC)   Abdominal pain   Tobacco dependence   Elevated LFTs   R heart failure -Patient with prior h/o CHD s/p repair presenting with worsening SOB and GI symptoms (see below) -CXR with cardiomegaly but no obvious pulmonary edema -CT with apparent hepatic  congestion and reflux with severe RA dilatation - concerning for R heart failure -Will admit, as per the Emergency HF Mortality Risk Grade.  The patient has: hemodynamic instability and cardiac arrhythmia with recurrent SVT to 170s -Will request echocardiogram -Was given Lasix 40 mg x 1 in ER and will repeat with 20 mg IV BID for gentle diuresis -prn [place] O2 for now -Normal kidney function at this time, will follow -Cardiology is following  SVT -EKGs were not clearly consistent with afib, appears to be more suggestive of SVT -Rate controlled obtained with BB -Will continue PO Lopressor as per cardiology and also add prn IV Lopressor  Abdominal pain with increased LFTs -RUQ US with concern for acute cholecystitis (acalculus) -Patient's primary complaint relates to worsening RUQ/midepigastric pain with retching, inability to take PO, and intermittent diarrhea -Obviously this is concerning for cholecystitis, although he also has liver issues associated with R heart failure, as above -Needs HIDA scan  in AM -Will keep NPO for now -Will cover empirically with Rocephin for now -Morphine for pain, Zofran for nausea -Surgery is following  Tobacco dependence -Encourage cessation.   -This was discussed with the patient and should be reviewed on an ongoing basis.   -Patch ordered at patient request.     Note: This patient has been tested and is negative for the novel coronavirus COVID-19.    DVT prophylaxis: Lovenox  Code Status:  Full - confirmed with patient Family Communication: None present Disposition Plan:  The patient is from: home  Anticipated d/c is to: home, possible with [facility] services  Anticipated d/c date will depend on clinical response to treatment, likely 2-4 days  Patient is currently: acutely ill Consults called: Cardiology; Surgery Admission status: Admit - It is my clinical opinion that admission to INPATIENT is reasonable and necessary because this patient will require  at least 2 midnights in the hospital to treat this condition based on the medical complexity of the problems presented.  Given the aforementioned information, the predictability of an adverse outcome is felt to be significant.     Jonah Blue MD [facility]   How to contact the [facility] Attending or Consulting provider 7A - 7P or covering provider during after hours 7P -7A, for this patient?  1. Check the care team in [facility] and look for a) attending/consulting [facility] provider listed and b) the [facility] team listed 2. Log into [email or web address] and use [employer]'s universal password to access. If you do not have the password, please contact the hospital operator. 3. Locate the [facility] provider you are looking for under [facility] and page to a number that you can be directly reached. 4. If you still have difficulty reaching the provider, please page the [facility] (Director on Call) for the Hospitalists listed on amion for assistance.   02/06/2020, 5:25 PM

## 2020-02-06 NOTE — Consult Note (Addendum)
[facility] Surgery Consult Note  Perry Cook May 05, 1986  [id number].    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  He presented to the ED complain abdominal pain especially on the right side, pain was associated with nausea, vomiting(more dry heaves than actual emesis), dizziness and dark urine. He has had pain almost daily for a month.  He has not been able to eat or drink much.  He continued to work, but is tired and worn out, hard to do his job. He knows he has lost weight, but does not know how much.  Decreased urine output with dark colored urine.    On admission he was tachycardic with heart rate in the 170s.  He was placed in resuscitation room B.  He was seen by cardiology and was shown to be in sinus tachycardia with an incomplete bundle branch block rate of 177.  He was given 3 doses of adenosine without conversion.  He then had 2 doses of metoprolol and converted to a sinus rhythm with a rate in the 80s. Currently his pain has returned and HR is again 150-170's; he is not aware of the elevated heart rate even with [place] telling him his HR is up.   Once his cardiac issues were controlled he underwent a CT of the abdomen the pelvis with contrast.  This shows cardiomegaly with severe right atrial dilatation reflux of contrast material into the liver with hepatomegaly (19.8 cm craniocaudal span,) likely secondary to passive hepatic congestion.  There is also evidence of hepatic steatosis and a small volume of ascites.  Abdominal ultrasound was then obtained this showed no appreciable gallstones.  The gallbladder wall is diffusely thickened and edematous with mild pericholecystic fluid.  Common bile duct was 3 mm, portal vein is patent on Doppler imaging with apparent bidirectional flow within the  portal vein.  There is also mild ascites.  The study was concerning for acalculous cholecystitis. LFT up Alk phos 54, AST 801, ALT 566. T bil 1.7,  Tylenol <10,  WBC 5.9, H/H 12.7/38.7, platelets 218K.  We are asked to see.    ROS: Review of Systems  Constitutional: Positive for weight loss (baseline around 150, he has not been able to eat or drink much for about 1 month.).  Eyes: Negative.   Respiratory: Positive for shortness of breath (DOE over the last month). Negative for cough, hemoptysis, sputum production and wheezing.   Cardiovascular: Positive for palpitations (HR 170's in ED, he is not aware of it.  Even now rate is up and he is not aware of rate elevation.). Negative for orthopnea, claudication, leg swelling and PND (he likes his head up, but no PND or orthopnea).  Gastrointestinal: Positive for abdominal pain (RUQ, anterior, going to right flank and back), diarrhea, nausea (with abdominal pain for a month) and vomiting (more dry heaves after eating and with abdominal pain.). Negative for blood in stool, constipation, heartburn and melena.       He also notes some abdominal swelling that is new to him  Genitourinary:       Decreased urine output and dark colored urine for a few weeks, he's not sure of real timeline.  Musculoskeletal: Negative.   Skin: Negative.   Neurological: Positive for headaches.       No hx of syncope  Endo/Heme/Allergies: Negative.  Psychiatric/Behavioral: Positive for depression. The patient is nervous/anxious.     No family history on file.  History reviewed:   Ebstein anomoly repair 2011 in [place] History:  has no history on file for tobacco use, alcohol use, and drug use.  Allergies: No Known Allergies  Prior to Admission medications   Medication Sig Start Date End Date Taking? Authorizing Provider  Pseudoeph-Doxylamine-DM-APAP (NYQUIL PO) Take 1 tablet by mouth at bedtime as needed (cold symptoms).   Yes [provider]     Blood pressure 110/80, pulse (!) 58, temperature 99.8 F (37.7 C), temperature source Oral, resp. rate 16, height 6' (1.829 m), weight 69.5 kg, SpO2 96 %. Physical Exam:  General: pleasant, ill appearing 34 y/o AA male in no acute distress HEENT: head is normocephalic, atraumatic.  Sclera are noninjected.  Pupils are equal.  Ears and nose without any masses or lesions.  Mouth is pink and moist,  + JVD Heart: regular, rate, 150-170's,   Palpable radial and pedal pulses bilaterally.  I did not hear a murmur, but his HR was up in the 170 range, Echo is pending by Cardiology.  He also had Defib pad and lead for telemetry covering most of his chest. Lungs: CTAB, no wheezes, rhonchi, or rales noted.  Respiratory effort nonlabored  Well healed sternotomy Abd: soft, tender, liver is almost down to his umbilicus,mild distension, BS are hypoactive, no masses, MS: all 4 extremities are symmetrical with no cyanosis, clubbing, or edema. Skin: warm and dry with no masses, lesions, or rashes Neuro: Cranial nerves 2-12 grossly intact, sensation is normal throughout Psych: A&Ox3 with an appropriate affect.   Results for orders placed or performed during the hospital encounter of 02/06/20 (from the past 48 hour(s))  SARS Coronavirus 2 by RT PCR (hospital order, performed in [facility] hospital lab) Nasopharyngeal Nasopharyngeal Swab     Status: None   Collection Time: 02/06/20 10:13 AM   Specimen: Nasopharyngeal Swab  Result Value Ref Range   SARS Coronavirus 2 NEGATIVE NEGATIVE    Comment: (NOTE) SARS-CoV-2 target nucleic acids are NOT DETECTED.  The SARS-CoV-2 RNA is generally detectable in upper and lower respiratory specimens during the acute phase of infection. The lowest concentration of SARS-CoV-2 viral copies this assay can detect is 250 copies / mL. A negative result does not preclude SARS-CoV-2 infection and should not be used as the sole basis for treatment or other patient management  decisions.  A negative result may occur with improper specimen collection / handling, submission of specimen other than nasopharyngeal swab, presence of viral mutation(s) within the areas targeted by this assay, and inadequate number of viral copies (<250 copies / mL). A negative result must be combined with clinical observations, patient history, and epidemiological information.  Fact Sheet for Patients:   StrictlyIdeas.no  Fact Sheet for Healthcare Providers: BankingDealers.co.za  This test is not yet approved or  cleared by the [place] FDA and has been authorized for detection and/or diagnosis of SARS-CoV-2 by FDA under an Emergency Use Authorization (EUA).  This EUA will remain in effect (meaning this test can be used) for the duration of the COVID-19 declaration under Section 564(b)(1) of the Act, 21 U.S.C. section 360bbb-3(b)(1), unless the authorization is terminated or revoked sooner.  Performed at [facility], [place] [address] [place], [place] [address]   CBC with Differential/Platelet     Status: Abnormal   Collection Time: 02/06/20 10:25 AM  Result  Value Ref Range   WBC 5.9 4.0 - 10.5 K/uL   RBC 4.30 4.22 - 5.81 MIL/uL   Hemoglobin 12.7 (L) 13.0 - 17.0 g/dL   HCT 38.7 (L) 39 - 52 %   MCV 90.0 80.0 - 100.0 fL   MCH 29.5 26.0 - 34.0 pg   MCHC 32.8 30.0 - 36.0 g/dL   RDW 15.8 (H) 11.5 - 15.5 %   Platelets 218 150 - 400 K/uL   nRBC 0.0 0.0 - 0.2 %   Neutrophils Relative % 73 %   Neutro Abs 4.3 1.7 - 7.7 K/uL   Lymphocytes Relative 14 %   Lymphs Abs 0.8 0.7 - 4.0 K/uL   Monocytes Relative 13 %   Monocytes Absolute 0.8 0 - 1 K/uL   Eosinophils Relative 0 %   Eosinophils Absolute 0.0 0 - 0 K/uL   Basophils Relative 0 %   Basophils Absolute 0.0 0 - 0 K/uL   Immature Granulocytes 0 %   Abs Immature Granulocytes 0.02 0.00 - 0.07 K/uL    Comment: Performed at [facility], [place]  [address] [place], [place] [address]  Comprehensive metabolic panel     Status: Abnormal   Collection Time: 02/06/20 10:25 AM  Result Value Ref Range   Sodium 133 (L) 135 - 145 mmol/L   Potassium 4.2 3.5 - 5.1 mmol/L   Chloride 96 (L) 98 - 111 mmol/L   CO2 24 22 - 32 mmol/L   Glucose, Bld 113 (H) 70 - 99 mg/dL    Comment: Glucose reference range applies only to samples taken after fasting for at least 8 hours.   BUN 12 6 - 20 mg/dL   Creatinine, Ser 0.93 0.61 - 1.24 mg/dL   Calcium 9.0 8.9 - 10.3 mg/dL   Total Protein 7.3 6.5 - 8.1 g/dL   Albumin 3.7 3.5 - 5.0 g/dL   AST 801 (H) 15 - 41 U/L   ALT 566 (H) 0 - 44 U/L   Alkaline Phosphatase 54 38 - 126 U/L   Total Bilirubin 1.7 (H) 0.3 - 1.2 mg/dL   GFR calc non Af Amer >60 >60 mL/min   GFR calc Af Amer >60 >60 mL/min   Anion gap 13 5 - 15    Comment: Performed at [facility], [place] [address] [place], [place] [address]  Lipase, blood     Status: None   Collection Time: 02/06/20 10:25 AM  Result Value Ref Range   Lipase 29 11 - 51 U/L    Comment: Performed at [facility], [place] [address], [place], [place] [address]  Acetaminophen level     Status: Abnormal   Collection Time: 02/06/20 10:25 AM  Result Value Ref Range   Acetaminophen (Tylenol), Serum <10 (L) 10 - 30 ug/mL    Comment: (NOTE) Therapeutic concentrations vary significantly. A range of 10-30 ug/mL  may be an effective concentration for many patients. However, some  are best treated at concentrations outside of this range. Acetaminophen concentrations >150 ug/mL at 4 hours after ingestion  and >50 ug/mL at 12 hours after ingestion are often associated with  toxic reactions.  Performed at [facility], [place] [address], [place], [place] [address]    DG Chest 2 View  Result Date: 02/06/2020 CLINICAL DATA:  Abdominal pain, tachycardia, congenital heart disease EXAM: CHEST - 2 VIEW COMPARISON:  None. FINDINGS: Lungs  are clear. No pneumothorax or pleural effusion. There is moderate global cardiomegaly with  particular enlargement of the right atrium. Median sternotomy has been performed. Widening of the right paratracheal stripe may relate to vascular shadow, particularly if there is history of prior shunt formation or inferior vena cava interruption. Lymphadenopathy may appear similarly. The pulmonary vascularity is normal. No acute bone abnormality. IMPRESSION: Moderate global cardiomegaly. Widening of the right paratracheal stripe, vascular shadow versus lymphadenopathy. This could be better assessed with CT imaging if prior examinations are unavailable for comparison. No radiographic evidence of acute cardiopulmonary disease. Electronically Signed   By: Fidela Salisbury MD   On: 02/06/2020 15:20   CT Abdomen Pelvis W Contrast  Result Date: 02/06/2020 CLINICAL DATA:  34 year old male with history of abdominal distension. History of Ebstein's anomaly. EXAM: CT ABDOMEN AND PELVIS WITH CONTRAST TECHNIQUE: Multidetector CT imaging of the abdomen and pelvis was performed using the standard protocol following bolus administration of intravenous contrast. CONTRAST:  16m OMNIPAQUE IOHEXOL 300 MG/ML  SOLN COMPARISON:  No priors. FINDINGS: Lower chest: Severe cardiomegaly with massively dilated right atrium. Hepatobiliary: Liver is enlarged measuring up to 19.8 cm in craniocaudal span. Diffuse low attenuation throughout the hepatic parenchyma, indicative of hepatic steatosis. Reflux of contrast material from the inferior vena cava through the hepatic veins, indicative of significant passive hepatic congestion. No discrete cystic or solid hepatic lesions. No intra or extrahepatic biliary ductal dilatation. Gallbladder is unremarkable in appearance. Pancreas: No pancreatic mass. No pancreatic ductal dilatation. No pancreatic or peripancreatic fluid collections or inflammatory changes. Spleen: Unremarkable. Adrenals/Urinary Tract:  Bilateral kidneys and adrenal glands are normal in appearance. No hydroureteronephrosis. Urinary bladder is nearly completely decompressed, and otherwise unremarkable in appearance. Stomach/Bowel: Normal appearance of the stomach. No pathologic dilatation of small bowel or colon. Normal appendix. Vascular/Lymphatic: No significant atherosclerotic disease, aneurysm or dissection noted in the abdominal or pelvic vasculature. No lymphadenopathy noted in the abdomen or pelvis. Reproductive: Prostate gland and seminal vesicles are unremarkable in appearance. Other: Small volume of ascites.  No pneumoperitoneum. Musculoskeletal: There are no aggressive appearing lytic or blastic lesions noted in the visualized portions of the skeleton. IMPRESSION: 1. Cardiomegaly with severe right atrial dilatation and reflux of contrast material into the liver with hepatomegaly, likely secondary to passive hepatic congestion. There is also evidence of hepatic steatosis and a small volume of ascites. 2. No other acute findings are noted in the abdomen or pelvis. Electronically Signed   By: DVinnie LangtonM.D.   On: 02/06/2020 12:57   [place]Abdomen Limited  Result Date: 02/06/2020 CLINICAL DATA:  Right upper quadrant pain EXAM: ULTRASOUND ABDOMEN LIMITED RIGHT UPPER QUADRANT COMPARISON:  CT abdomen and pelvis February 06, 2020 FINDINGS: Gallbladder: No gallstones are appreciable. The gallbladder wall is diffusely thickened and edematous with mild pericholecystic fluid. No sonographic Murphy sign noted by sonographer. Common bile duct: Diameter: 3 mm. Liver: No focal lesion identified. Within normal limits in parenchymal echogenicity. Portal vein is patent on color Doppler imaging with apparent bidirectional flow within the portal vein. Other: There is mild ascites. IMPRESSION: 1. No gallstones evident. Gallbladder wall thickening with edema and pericholecystic fluid. While ascites may cause gallbladder wall thickening, the overall  appearance of the gallbladder is concerning for acalculus cholecystitis. In this regard, it may be reasonable to consider nuclear medicine hepatobiliary imaging study to assess for cystic duct patency. 2.  Mild ascites. 3. Bidirectional flow in the portal vein. Portal vein appears patent. Electronically Signed   By: WLowella GripIII M.D.   On: 02/06/2020 14:27  Assessment/Plan SVT  - HR up to 170 - he does not feel tachycardia Ebstein's anomaly with repair at age 26  - no recent follow up Weight loss Anorexia  Dehydration/malnutrition  Right upper quadrant abdominal pain - nausea, vomiting/dry heaves Possible acalculous cholecystitis vs passive hepatic congestion Ascites   FEN:  NPO/IV fluids ID: Rocephin 9/14 >> day 1 DVT:  Per Medicine/SCD Follow up:  TBD  Plan:  We are ask to see to rule out acalculous cholecystitis vs swelling and edema due to his hepatic congestion.  His symptoms have been ongoing for 1 month as best he can remember, so I don't think this is cholecystitis, but we will order a HIDA and he has been started on Rocephin.   We will follow with you.    Earnstine Regal [facility] 02/06/2020, 3:57 PM Please see Amion for pager number during day hours 7:00am-4:30pm

## 2020-02-06 NOTE — ED Triage Notes (Signed)
Pt arrives today c/o abd pain, especially on the right side.  Pt has hx of congenital heart issues- pt is tachy in the 170s. Pt brought to resus room.

## 2020-02-06 NOTE — ED Provider Notes (Signed)
°  Provider Note MRN:  [id number]  Arrival date & time: 02/06/20    ED Course and Medical Decision Making  Assumed care from Dr. Freida Busman at shift change.  Called to bedside for tachycardia, heart rate ranging between 150 and 170.  Patient has abdominal discomfort and imaging suggestive of possible cholecystitis.  Plan is to admit to hospital service with general surgery following.  Patient was not responsive to adenosine, cardiology has been consulted and patient seems to have SVT with an Ebstein's anomaly status post surgical correction.  They are recommending metoprolol, will provide additional IV dose.  .Critical Care Performed by: Sabas Sous, MD Authorized by: Sabas Sous, MD   Critical care provider statement:    Critical care time (minutes):  32   Critical care was necessary to treat or prevent imminent or life-threatening deterioration of the following conditions: Supraventricular tachycardia.   Critical care was time spent personally by me on the following activities:  Discussions with consultants, evaluation of patient's response to treatment, examination of patient, ordering and performing treatments and interventions, ordering and review of laboratory studies, ordering and review of radiographic studies, pulse oximetry, re-evaluation of patient's condition, obtaining history from patient or surrogate and review of old charts   I assumed direction of critical care for this patient from another provider in my specialty: yes      Final Clinical Impressions(s) / ED Diagnoses     ICD-10-CM   1. Cough  R05 DG Chest 2 View    DG Chest 2 View  2. SVT (supraventricular tachycardia) (HCC)  I47.1     ED Discharge Orders    None      Discharge Instructions   None     Elmer Sow. Pilar Plate, MD [facility] [email or web address]    Sabas Sous, MD 02/06/20 [phone]

## 2020-02-06 NOTE — Progress Notes (Signed)
  Echocardiogram 2D Echocardiogram has been performed.  Perry Cook 02/06/2020, 2:12 PM

## 2020-02-06 NOTE — ED Notes (Signed)
Provider notified of recurring tachycardia

## 2020-02-07 ENCOUNTER — Encounter (HOSPITAL_COMMUNITY): Payer: Self-pay | Admitting: Internal Medicine

## 2020-02-07 ENCOUNTER — Inpatient Hospital Stay (HOSPITAL_COMMUNITY): Payer: Self-pay

## 2020-02-07 DIAGNOSIS — Q228 Other congenital malformations of tricuspid valve: Secondary | ICD-10-CM

## 2020-02-07 DIAGNOSIS — Q249 Congenital malformation of heart, unspecified: Secondary | ICD-10-CM | POA: Insufficient documentation

## 2020-02-07 DIAGNOSIS — R570 Cardiogenic shock: Secondary | ICD-10-CM | POA: Diagnosis present

## 2020-02-07 DIAGNOSIS — I483 Typical atrial flutter: Secondary | ICD-10-CM

## 2020-02-07 LAB — COOXEMETRY PANEL
Carboxyhemoglobin: 0.8 % (ref 0.5–1.5)
Carboxyhemoglobin: 1.1 % (ref 0.5–1.5)
Carboxyhemoglobin: 1.1 % (ref 0.5–1.5)
Methemoglobin: 0.7 % (ref 0.0–1.5)
Methemoglobin: 1.1 % (ref 0.0–1.5)
Methemoglobin: 1.1 % (ref 0.0–1.5)
O2 Saturation: 46.1 %
O2 Saturation: 45.5 %
O2 Saturation: 50 %
Total hemoglobin: 12 g/dL (ref 12.0–16.0)
Total hemoglobin: 12.5 g/dL (ref 12.0–16.0)
Total hemoglobin: 12.2 g/dL (ref 12.0–16.0)

## 2020-02-07 LAB — BASIC METABOLIC PANEL
Anion gap: 19 — ABNORMAL HIGH (ref 5–15)
Anion gap: 13 (ref 5–15)
BUN: 22 mg/dL — ABNORMAL HIGH (ref 6–20)
BUN: 29 mg/dL — ABNORMAL HIGH (ref 6–20)
CO2: 15 mmol/L — ABNORMAL LOW (ref 22–32)
CO2: 20 mmol/L — ABNORMAL LOW (ref 22–32)
Calcium: 8.1 mg/dL — ABNORMAL LOW (ref 8.9–10.3)
Calcium: 8 mg/dL — ABNORMAL LOW (ref 8.9–10.3)
Chloride: 100 mmol/L (ref 98–111)
Chloride: 97 mmol/L — ABNORMAL LOW (ref 98–111)
Creatinine, Ser: 1.82 mg/dL — ABNORMAL HIGH (ref 0.61–1.24)
Creatinine, Ser: 2.28 mg/dL — ABNORMAL HIGH (ref 0.61–1.24)
GFR calc Af Amer: 55 mL/min — ABNORMAL LOW (ref >60)
GFR calc Af Amer: 42 mL/min — ABNORMAL LOW (ref >60)
GFR calc non Af Amer: 47 mL/min — ABNORMAL LOW (ref >60)
GFR calc non Af Amer: 36 mL/min — ABNORMAL LOW (ref >60)
Glucose, Bld: 25 mg/dL — CL (ref 70–99)
Glucose, Bld: 140 mg/dL — ABNORMAL HIGH (ref 70–99)
Potassium: 6 mmol/L — ABNORMAL HIGH (ref 3.5–5.1)
Potassium: 5.1 mmol/L (ref 3.5–5.1)
Sodium: 134 mmol/L — ABNORMAL LOW (ref 135–145)
Sodium: 130 mmol/L — ABNORMAL LOW (ref 135–145)

## 2020-02-07 LAB — COMPREHENSIVE METABOLIC PANEL
ALT: 3023 U/L — ABNORMAL HIGH (ref 0–44)
AST: 8536 U/L — ABNORMAL HIGH (ref 15–41)
Albumin: 2.4 g/dL — ABNORMAL LOW (ref 3.5–5.0)
Alkaline Phosphatase: 43 U/L (ref 38–126)
Anion gap: 12 (ref 5–15)
BUN: 22 mg/dL — ABNORMAL HIGH (ref 6–20)
CO2: 14 mmol/L — ABNORMAL LOW (ref 22–32)
Calcium: 5.9 mg/dL — CL (ref 8.9–10.3)
Chloride: 109 mmol/L (ref 98–111)
Creatinine, Ser: 1.65 mg/dL — ABNORMAL HIGH (ref 0.61–1.24)
GFR calc Af Amer: >60 mL/min (ref >60)
GFR calc non Af Amer: 53 mL/min — ABNORMAL LOW (ref >60)
Glucose, Bld: 91 mg/dL (ref 70–99)
Potassium: 5.4 mmol/L — ABNORMAL HIGH (ref 3.5–5.1)
Sodium: 135 mmol/L (ref 135–145)
Total Bilirubin: 2.1 mg/dL — ABNORMAL HIGH (ref 0.3–1.2)
Total Protein: 5.2 g/dL — ABNORMAL LOW (ref 6.5–8.1)

## 2020-02-07 LAB — HEPATIC FUNCTION PANEL
ALT: 4002 U/L — ABNORMAL HIGH (ref 0–44)
AST: >10000 U/L — ABNORMAL HIGH (ref 15–41)
Albumin: 3.6 g/dL (ref 3.5–5.0)
Alkaline Phosphatase: 60 U/L (ref 38–126)
Bilirubin, Direct: 1.4 mg/dL — ABNORMAL HIGH (ref 0.0–0.2)
Indirect Bilirubin: 1.3 mg/dL — ABNORMAL HIGH (ref 0.3–0.9)
Total Bilirubin: 2.7 mg/dL — ABNORMAL HIGH (ref 0.3–1.2)
Total Protein: 7.4 g/dL (ref 6.5–8.1)

## 2020-02-07 LAB — URINE CULTURE: Culture: NO GROWTH

## 2020-02-07 LAB — CBC
HCT: 36.1 % — ABNORMAL LOW (ref 39.0–52.0)
Hemoglobin: 10.9 g/dL — ABNORMAL LOW (ref 13.0–17.0)
MCH: 29.4 pg (ref 26.0–34.0)
MCHC: 30.2 g/dL (ref 30.0–36.0)
MCV: 97.3 fL (ref 80.0–100.0)
Platelets: 145 10*3/uL — ABNORMAL LOW (ref 150–400)
RBC: 3.71 MIL/uL — ABNORMAL LOW (ref 4.22–5.81)
RDW: 16.4 % — ABNORMAL HIGH (ref 11.5–15.5)
WBC: 14.1 10*3/uL — ABNORMAL HIGH (ref 4.0–10.5)
nRBC: 0.1 % (ref 0.0–0.2)

## 2020-02-07 LAB — CBG MONITORING, ED
Glucose-Capillary: 53 mg/dL — ABNORMAL LOW (ref 70–99)
Glucose-Capillary: 135 mg/dL — ABNORMAL HIGH (ref 70–99)

## 2020-02-07 LAB — GLUCOSE, CAPILLARY
Glucose-Capillary: 102 mg/dL — ABNORMAL HIGH (ref 70–99)
Glucose-Capillary: 107 mg/dL — ABNORMAL HIGH (ref 70–99)

## 2020-02-07 LAB — LACTIC ACID, PLASMA
Lactic Acid, Venous: 7.2 mmol/L (ref 0.5–1.9)
Lactic Acid, Venous: 5.3 mmol/L (ref 0.5–1.9)
Lactic Acid, Venous: 4.7 mmol/L (ref 0.5–1.9)

## 2020-02-07 LAB — RAPID URINE DRUG SCREEN, HOSP PERFORMED
Amphetamines: NOT DETECTED
Barbiturates: NOT DETECTED
Benzodiazepines: NOT DETECTED
Cocaine: NOT DETECTED
Opiates: NOT DETECTED
Tetrahydrocannabinol: NOT DETECTED

## 2020-02-07 LAB — MRSA PCR SCREENING: MRSA by PCR: NEGATIVE

## 2020-02-07 LAB — BRAIN NATRIURETIC PEPTIDE: B Natriuretic Peptide: 862.2 pg/mL — ABNORMAL HIGH (ref 0.0–100.0)

## 2020-02-07 LAB — TSH: TSH: 8.762 u[IU]/mL — ABNORMAL HIGH (ref 0.350–4.500)

## 2020-02-07 LAB — TROPONIN I (HIGH SENSITIVITY): Troponin I (High Sensitivity): 22 ng/L — ABNORMAL HIGH (ref <18)

## 2020-02-07 MED ORDER — DEXTROSE 50 % IV SOLN
1.0000 | Freq: Once | INTRAVENOUS | Status: AC
  Filled 2020-02-07: qty 50

## 2020-02-07 MED ORDER — HEPARIN SODIUM (PORCINE) 5000 UNIT/ML IJ SOLN
5000.0000 [IU] | Freq: Three times a day (TID) | INTRAMUSCULAR | Status: DC
  Administered 2020-02-07: 5000 [IU] via SUBCUTANEOUS
  Filled 2020-02-07: qty 1

## 2020-02-07 MED ORDER — DIGOXIN 125 MCG PO TABS
0.1250 mg | ORAL_TABLET | Freq: Every day | ORAL | Status: DC
  Administered 2020-02-08 – 2020-02-18 (×11): 0.125 mg via ORAL
  Filled 2020-02-07 (×11): qty 1

## 2020-02-07 MED ORDER — CLONAZEPAM 0.5 MG PO TABS: 0.2500 mg | ORAL_TABLET | Freq: Four times a day (QID) | ORAL | Status: DC | PRN

## 2020-02-07 MED ORDER — DEXTROSE 50 % IV SOLN
INTRAVENOUS | Status: AC
  Administered 2020-02-07: 50 mL
  Filled 2020-02-07: qty 50

## 2020-02-07 MED ORDER — METOPROLOL TARTRATE 5 MG/5ML IV SOLN
5.0000 mg | Freq: Four times a day (QID) | INTRAVENOUS | Status: DC
  Administered 2020-02-07: 5 mg via INTRAVENOUS
  Filled 2020-02-07: qty 5

## 2020-02-07 MED ORDER — DEXTROSE 5 % IV SOLN: INTRAVENOUS | Status: DC

## 2020-02-07 MED ORDER — SODIUM ZIRCONIUM CYCLOSILICATE 10 G PO PACK
10.0000 g | PACK | Freq: Once | ORAL | Status: AC
  Administered 2020-02-07: 10 g via ORAL
  Filled 2020-02-07: qty 1

## 2020-02-07 MED ORDER — FUROSEMIDE 10 MG/ML IJ SOLN
40.0000 mg | Freq: Two times a day (BID) | INTRAMUSCULAR | Status: DC
  Administered 2020-02-07: 40 mg via INTRAVENOUS
  Filled 2020-02-07: qty 4

## 2020-02-07 MED ORDER — AMIODARONE HCL IN DEXTROSE 360-4.14 MG/200ML-% IV SOLN
30.0000 mg/h | INTRAVENOUS | Status: DC
  Administered 2020-02-08 – 2020-02-11 (×14): 60 mg/h via INTRAVENOUS
  Administered 2020-02-11 – 2020-02-12 (×2): 30 mg/h via INTRAVENOUS
  Filled 2020-02-07 (×16): qty 200

## 2020-02-07 MED ORDER — METOPROLOL TARTRATE 5 MG/5ML IV SOLN: 2.5000 mg | INTRAVENOUS | Status: DC | PRN

## 2020-02-07 MED ORDER — MILRINONE LACTATE IN DEXTROSE 20-5 MG/100ML-% IV SOLN
0.2500 ug/kg/min | INTRAVENOUS | Status: DC
  Administered 2020-02-07 – 2020-02-09 (×3): 0.25 ug/kg/min via INTRAVENOUS
  Filled 2020-02-07 (×2): qty 100

## 2020-02-07 MED ORDER — AMIODARONE LOAD VIA INFUSION
150.0000 mg | Freq: Once | INTRAVENOUS | Status: DC
  Filled 2020-02-07: qty 83.34

## 2020-02-07 MED ORDER — SODIUM BICARBONATE 8.4 % IV SOLN
50.0000 meq | Freq: Once | INTRAVENOUS | Status: AC
  Administered 2020-02-07: 50 meq via INTRAVENOUS
  Filled 2020-02-07: qty 50

## 2020-02-07 MED ORDER — LIDOCAINE HCL (PF) 1 % IJ SOLN
INTRAMUSCULAR | Status: AC
  Filled 2020-02-07: qty 5

## 2020-02-07 MED ORDER — METOPROLOL TARTRATE 5 MG/5ML IV SOLN: 5.0000 mg | Freq: Four times a day (QID) | INTRAVENOUS | Status: DC | PRN

## 2020-02-07 MED ORDER — AMIODARONE IV BOLUS ONLY 150 MG/100ML
150.0000 mg | Freq: Once | INTRAVENOUS | Status: AC
  Administered 2020-02-07: 150 mg via INTRAVENOUS

## 2020-02-07 MED ORDER — SODIUM CHLORIDE 0.9 % IV SOLN: INTRAVENOUS | Status: DC

## 2020-02-07 MED ORDER — FUROSEMIDE 10 MG/ML IJ SOLN
120.0000 mg | Freq: Once | INTRAVENOUS | Status: AC
  Administered 2020-02-08: 120 mg via INTRAVENOUS
  Filled 2020-02-07: qty 10

## 2020-02-07 MED ORDER — AMIODARONE HCL IN DEXTROSE 360-4.14 MG/200ML-% IV SOLN
INTRAVENOUS | Status: AC
  Administered 2020-02-07: 150 mg via INTRAVENOUS
  Filled 2020-02-07: qty 200

## 2020-02-07 MED ORDER — MILRINONE LACTATE IN DEXTROSE 20-5 MG/100ML-% IV SOLN
0.1250 ug/kg/min | INTRAVENOUS | Status: DC
  Administered 2020-02-07: 0.125 ug/kg/min via INTRAVENOUS
  Filled 2020-02-07: qty 100

## 2020-02-07 MED ORDER — CALCIUM GLUCONATE-NACL 2-0.675 GM/100ML-% IV SOLN
2.0000 g | Freq: Once | INTRAVENOUS | Status: AC
  Administered 2020-02-07: 2000 mg via INTRAVENOUS
  Filled 2020-02-07: qty 100

## 2020-02-07 MED ORDER — DIGOXIN 0.25 MG/ML IJ SOLN
0.2500 mg | INTRAMUSCULAR | Status: AC
  Administered 2020-02-07: 0.25 mg via INTRAVENOUS
  Filled 2020-02-07 (×3): qty 1

## 2020-02-07 MED ORDER — HEPARIN BOLUS VIA INFUSION
3500.0000 [IU] | Freq: Once | INTRAVENOUS | Status: AC
  Administered 2020-02-07: 3500 [IU] via INTRAVENOUS
  Filled 2020-02-07: qty 3500

## 2020-02-07 MED ORDER — DEXTROSE 50 % IV SOLN
1.0000 | Freq: Once | INTRAVENOUS | Status: AC
  Administered 2020-02-07: 50 mL via INTRAVENOUS

## 2020-02-07 MED ORDER — SODIUM CHLORIDE 0.9% FLUSH: 10.0000 mL | Freq: Two times a day (BID) | INTRAVENOUS | Status: DC

## 2020-02-07 MED ORDER — AMIODARONE HCL IN DEXTROSE 360-4.14 MG/200ML-% IV SOLN
60.0000 mg/h | INTRAVENOUS | Status: DC
  Administered 2020-02-07 (×2): 60 mg/h via INTRAVENOUS
  Filled 2020-02-07: qty 200

## 2020-02-07 MED ORDER — SODIUM CHLORIDE 0.9% FLUSH: 10.0000 mL | INTRAVENOUS | Status: DC | PRN

## 2020-02-07 MED ORDER — DIGOXIN 0.25 MG/ML IJ SOLN: 0.1250 mg | Freq: Every day | INTRAMUSCULAR | Status: DC

## 2020-02-07 MED ORDER — AMIODARONE LOAD VIA INFUSION: 150.0000 mg | Freq: Once | INTRAVENOUS | Status: AC

## 2020-02-07 MED ORDER — HEPARIN (PORCINE) 25000 UT/250ML-% IV SOLN
1450.0000 [IU]/h | INTRAVENOUS | Status: DC
  Administered 2020-02-07: 1050 [IU]/h via INTRAVENOUS
  Administered 2020-02-08: 1250 [IU]/h via INTRAVENOUS
  Administered 2020-02-09: 1700 [IU]/h via INTRAVENOUS
  Administered 2020-02-09: 1600 [IU]/h via INTRAVENOUS
  Administered 2020-02-10: 1700 [IU]/h via INTRAVENOUS
  Administered 2020-02-10 – 2020-02-12 (×4): 1650 [IU]/h via INTRAVENOUS
  Administered 2020-02-13: 1600 [IU]/h via INTRAVENOUS
  Filled 2020-02-07 (×10): qty 250

## 2020-02-07 MED ORDER — CLONAZEPAM 0.25 MG PO TBDP: 0.2500 mg | ORAL_TABLET | Freq: Four times a day (QID) | ORAL | Status: DC | PRN

## 2020-02-07 MED ORDER — CHLORHEXIDINE GLUCONATE CLOTH 2 % EX PADS
6.0000 | MEDICATED_PAD | Freq: Every day | CUTANEOUS | Status: DC
  Administered 2020-02-07 – 2020-02-17 (×9): 6 via TOPICAL

## 2020-02-07 MED ORDER — AMIODARONE HCL IN DEXTROSE 360-4.14 MG/200ML-% IV SOLN: 30.0000 mg/h | INTRAVENOUS | Status: DC

## 2020-02-07 NOTE — Progress Notes (Signed)
  Now on milrinone 0.25 and amio 60.   HR down to 110-120s. Co-ox up to 50%.   Cr continues to climb 1.65 -> 2.28  CVP 34.   Lactic acid 7.2 -> 5.3  K stable at 5.1   Remains very tenuous but may be beginning to stabilize. Will give 1 dose lasix 120 IV and assess response.   Additional CCT 35 mins.   Arvilla Meres, MD  11:46 PM

## 2020-02-07 NOTE — Consult Note (Addendum)
NAME:  Perry Cook, MRN:  [id number], DOB:  02-07-1986, LOS: 1 ADMISSION DATE:  02/06/2020, CONSULTATION DATE: 02/07/20 REFERRING MD:  Dr. Rito Ehrlich, CHIEF COMPLAINT:  Abd pain, nausea  Brief History   34 y/o M with history of Ebstein's anomaly s/p repair 2002 presenting at [facility] in SVT complaining of RUQ abdominal pain for the past month.   History of present illness   34 y/o M with PMH of history of Ebstein's anomaly who presented to [facility] ER on 9/14 with RUQ abdominal pain and intermittent palpitations.  He has been experiencing RUQ abdominal pain for about a month.  He has noticed a decrease in his appetite due to nausea and admits to intermittent diarrhea.  He also has noted palpitations when resting.  He works at [employer] and has been experiencing shortness of breath with exertion. Pt reports history of vaping and 1-2 alcoholic drinks per day "but more when out at a show".  He states that he has not vaped or had any alcohol for the past month because he felt so poorly.   In the [facility] ED he was found to be in SVT with a HR of 177 bpm, which converted to NSR briefly after adenosine and metoprolol. Heart rate has been elevated in 140's-150's since 1900 9/14 with transient hypotension with SBP in 80's.  Abdominal CT showed hepatomegaly, indicative of hepatic steatosis, with significant passive hepatic congestion.   Labs significant for K+: 6.0, Glucose: 25~53 (required dextrose IV)~135, AG: 19, BUN: 22, Cr 0.9 which rose to 1.82, AST: > 10,000, ALT: 4,002, Bili: 1.4.    He was initially diagnosed with Ebstein's after collapsing at football practice. 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  Pt denies recent injuries, falls, fevers/chills, weight gain, LE swelling.   Past Medical History  CHD- Ebstein's anomaly syndrome s/p repair in 2002  Significant Hospital Events   9/14 Admit  9/15 PCCM consulted    Consults:    Procedures:     Significant Diagnostic Tests:  9/14 CT ABD / Pelvis >> cardiomegaly with severe right atrial dilatation and reflux of contrast media into the liver with hepatomegaly, hepatic steatosis, small volume ascites 9/14 [place] ABD >> no gallstones, gallbladder wall thickening with edema and pericholecystic fluid, mild ascites, bidirectional flow in the portal vein 9/15 ECHO >>  9/15 LE Doppler >>  9/15 HIDA Scan >>   Micro Data:  COVID 9/14 >> negative  UC 9/14 >>  UA 9/14 >> negative  Antimicrobials:  Ceftriaxone 9/15 >>   Interim history/subjective:  Pt reports abdominal pain and palpitations  Objective   Blood pressure 105/73, pulse (!) 143, temperature 98 F (36.7 C), temperature source Oral, resp. rate 19, height 6' (1.829 m), weight 69.5 kg, SpO2 91 %.        Intake/Output Summary (Last 24 hours) at 02/07/2020 1327 Last data filed at 02/06/2020 1657 Gross per 24 hour  Intake 100 ml  Output --  Net 100 ml   Filed Weights   02/06/20 0949  Weight: 69.5 kg    Examination: General: pleasant, thin young adult male lying on ER stretcher  PSY: quiet, little eye contact HEENT: MM pink/moist, wearing glasses, anicteric  Neuro: AAOx4, speech clear, MAE CV: s1s2 rrr, tachy, heave noted, no m/r/g PULM: non-labored, lungs bilaterally clear  GI: soft, bsx4 active  Extremities: warm/dry, no edema  Skin: no rashes or lesions  PCXR 9/15 >> images personally reviewed, cardiomegaly, no acute infiltrate   Resolved Hospital Problem list      Assessment & Plan:   Cardiogenic Shock in setting of Ebstein's Anomaly s/p Repair  Acute on Chronic Right-sided Heart Failure In the setting of Ebstein's anomaly syndrome s/p repair (2002).  Pt prescribed digoxin and toprol XL with non compliance since 2004. CT showed cardiomegaly and severely dilated right atrium, severe TR and bi-directional flow in hepatic veins. -Digoxin and metoprolol ordered for rate  control -Antiarrythmic medications challenging due to transaminases & AKI, defer to advanced heart failure team  -BNP & troponin ordered, echo pending -IVF discontinued -inotrope/vasopressors as needed to maintain MAP > 65 -appreciate cardiology input  -place central access, follow co-ox, CVP   Atrial Flutter with RVR  -defer antiarrythmics to CHF service  -will need TEE / DCCV -assess TSH  Shock Liver  Abdominal CT showed hepatomegaly (19.8 cm) with findings indicative of hepatic steatosis and significant passive hepatic congestion.  Tylenol level negative.  -trend Lactate, PCT -ceftriaxone continued  -monitor daily liver enzymes -Hida scan ordered > would defer in light of cardiogenic shock.  Suspect findings are related to heart failure   Acute Kidney Injury in the setting of Metabolic Acidosis with Hyperkalemia Lactic Acidosis  -lokelma x1 now  -stat labs now  -Trend BMP / urinary output -Replace electrolytes as indicated -Avoid nephrotoxic agents, ensure adequate renal perfusion  Normocytic Anemia  Thrombocytopenia  -trend CBC -change to heparin for DVT prophylaxis in light of renal failure  -monitor platelets   Hypoglycemia Suspect in setting of hepatic dysfunction with heart failure -D5w at 20 ml/hr  -follow CBG Q4 hours   Hypocalcemia  -2gm calcium now   Best practice:  Diet: Heart healthy as tolerated  Pain/Anxiety/Delirium protocol (if indicated): n/a  VAP protocol (if indicated): n/a  DVT prophylaxis: heparin  GI prophylaxis: n/a  Glucose control: CBG Q4 Mobility: As tolerated  Code Status: Full Code  Family Communication: Patient updated on plan of care  Disposition: ICU, 2H for advanced heart failure   Labs   CBC: Recent Labs  Lab 02/06/20 1025  WBC 5.9  NEUTROABS 4.3  HGB 12.7*  HCT 38.7*  MCV 90.0  PLT 218    Basic Metabolic Panel: Recent Labs  Lab 02/06/20 1025 02/07/20 0531  NA 133* 134*  K 4.2 6.0*  CL 96* 100  CO2 24 15*   GLUCOSE 113* 25*  BUN 12 22*  CREATININE 0.93 1.82*  CALCIUM 9.0 8.1*   GFR: Estimated Creatinine Clearance: 56.2 mL/min (A) (by C-G formula based on SCr of 1.82 mg/dL (H)). Recent Labs  Lab 02/06/20 1025  WBC 5.9    Liver Function Tests: Recent Labs  Lab 02/06/20 1025 02/07/20 0531  AST 801* >10,000*  ALT 566* 4,002*  ALKPHOS 54 60  BILITOT 1.7* 2.7*  PROT 7.3 7.4  ALBUMIN 3.7 3.6   Recent Labs  Lab 02/06/20 1025  LIPASE 29   No results for input(s): AMMONIA in the last 168 hours.  ABG No results found for: PHART, PCO2ART, PO2ART, HCO3, TCO2, ACIDBASEDEF, O2SAT   Coagulation Profile: No results for input(s): INR, PROTIME in the last 168 hours.  Cardiac Enzymes: No results for input(s): CKTOTAL, CKMB, CKMBINDEX, TROPONINI in the last 168 hours.  HbA1C: No results found for: HGBA1C  CBG: Recent Labs  Lab 02/07/20 0744 02/07/20 0840  GLUCAP 53* 135*    Review of Systems: Positives in [place]   Gen: Denies fever, chills,  weight change, fatigue, night sweats HEENT: Denies blurred vision, double vision, hearing loss, tinnitus, sinus congestion, rhinorrhea, sore throat, neck stiffness, dysphagia PULM: Denies shortness of breath, cough, sputum production, hemoptysis, wheezing CV: Denies chest pain, edema, orthopnea, paroxysmal nocturnal dyspnea, palpitations GI: Denies abdominal pain, nausea, vomiting, diarrhea, hematochezia, melena, constipation, change in bowel habits GU: Denies dysuria, hematuria, polyuria, oliguria, urethral discharge Endocrine: Denies hot or cold intolerance, polyuria, polyphagia or appetite change Derm: Denies rash, dry skin, scaling or peeling skin change Heme: Denies easy bruising, bleeding, bleeding gums Neuro: Denies headache, numbness, weakness, slurred speech, loss of memory or consciousness  Past Medical History  He,  has a past medical history of Congenital heart disease.   Surgical History    Past Surgical History:   Procedure Laterality Date  . Epstein anomaly repair  06/2000     Social History   reports that he has been smoking. He started smoking about 16 years ago. He has been smoking about 1.00 pack per day. He has never used smokeless tobacco. He reports current alcohol use. He reports previous drug use.   Family History   His family history is not on file.   Allergies No Known Allergies   Home Medications  Prior to Admission medications   Not on File     Critical care time: 40 minutes      Canary Brim, MSN, NP-C [facility] 02/07/2020, 1:27 PM   Please see [email or web address] for pager details.

## 2020-02-07 NOTE — ED Notes (Signed)
Pt given OJ 

## 2020-02-07 NOTE — Procedures (Signed)
Central Venous Catheter Insertion Procedure Note  Ameya Kutz  [id number]  08/10/1985  Date:02/07/20  Time:6:30 PM   Provider Performing:Poseidon Pam A Leone Mobley   Procedure: Insertion of Non-tunneled Central Venous [phone]) with US guidance (33825)   Indication(s) Medication administration  Consent Risks of the procedure as well as the alternatives and risks of each were explained to the patient and/or caregiver.  Consent for the procedure was obtained and is signed in the bedside chart  Anesthesia Topical only with 1% lidocaine   Timeout Verified patient identification, verified procedure, site/side was marked, verified correct patient position, special equipment/implants available, medications/allergies/relevant history reviewed, required imaging and test results available.  Sterile Technique Maximal sterile technique including full sterile barrier drape, hand hygiene, sterile gown, sterile gloves, mask, hair covering, sterile ultrasound probe cover (if used).  Procedure Description Area of catheter insertion was cleaned with chlorhexidine and draped in sterile fashion.  With real-time ultrasound guidance a central venous catheter was placed into the right internal jugular vein. Nonpulsatile blood flow and easy flushing noted in all ports.  The catheter was sutured in place and sterile dressing applied.  Complications/Tolerance None; patient tolerated the procedure well. Chest X-ray is ordered to verify placement for internal jugular or subclavian cannulation.   Chest x-ray is not ordered for femoral cannulation.  EBL Minimal  Specimen(s) None

## 2020-02-07 NOTE — Progress Notes (Addendum)
Advanced Heart Failure Team Consult Note   Primary Physician: Patient, No Pcp Per PCP-Cardiologist:  Perry Fair, MD  Reason for Consultation: Cardiogenic shock  HPI:     Perry Cook is a 34 year old male with a hx of Ebstein's anomaly syndrome s/p repair 2011 whom we are asked to see by Dr. Addison Lank for cardiogenic shock  He has a hx of Ebstein's anomaly syndrome with repair at age 89 yo.  He reports he was initially diagnosed after collapsing during football practice.  He states he was previously treated for unknown arrhythmia in the postsurgical setting with digoxin and beta-blocker.  He was only on medication for short period of time.  He has not followed with cardiology care in many years.  Says he began having nausea an ab pain about 2 weeks ago. He denies recently illness with fever or chills.  In ED, EKG showed AFL with rate at 177 bpm. He was given three doses of adenosine without conversion at which time he received two doses of metoprolol, both at 2.5mg  IV with conversion to NSR with rates in the 80's. He maintained NSR with marked 1AVB throughout the morning today and then developed recurrent AFL with cardiogenic shock with LFTs > 10k and lactate 7.2. Creatinine 0.9 -> 1.6.  Dr. Addison Lank discussed anti-arrhythmic therapy with EP and not felt to be good candidate for any agent amio (shock liver), flecainide (RV failure), dofetilide (AKI).   He is relatively asymptomatic aside from some mild nausea. Denies CP or SOB.  Review of Systems: [y] = yes, [ ]  = no   . General: Weight gain [ ] ; Weight loss [ ] ; Anorexia [ y]; Fatigue [ y]; Fever [ ] ; Chills [ ] ; Weakness ]  . Cardiac: Chest pain/pressure [ ] ; Resting SOB [ ] ; Exertional SOB [ ] ; Orthopnea [ ] ; Pedal Edema [ ] ; Palpitations [ ] ; Syncope [ ] ; Presyncope [ ] ; Paroxysmal nocturnal dyspnea[ ]   . Pulmonary: Cough [ ] ; Wheezing[ ] ; Hemoptysis[ ] ; Sputum [ ] ; Snoring [ ]   . GI: Vomiting[y ]; Dysphagia[ ] ; Melena[  ]; Hematochezia [ ] ; Heartburn[ ] ; Abdominal pain [ y]; Constipation [ ] ; Diarrhea [ ] ; BRBPR [ ]   . GU: Hematuria[ ] ; Dysuria [ ] ; Nocturia[ ]   . Vascular: Pain in legs with walking [ ] ; Pain in feet with lying flat [ ] ; Non-healing sores [ ] ; Stroke [ ] ; TIA [ ] ; Slurred speech [ ] ;  . Neuro: Headaches[ ] ; Vertigo[ ] ; Seizures[ ] ; Paresthesias[ ] ;Blurred vision [ ] ; Diplopia [ ] ; Vision changes [ ]   . Ortho/Skin: Arthritis [ ] ; Joint pain [ ] ; Muscle pain [ ] ; Joint swelling [ ] ; Back Pain [ ] ; Rash [ ]   . Psych: Depression[ ] ; Anxiety[ ]   . Heme: Bleeding problems [ ] ; Clotting disorders [ ] ; Anemia [ ]   . Endocrine: Diabetes [ ] ; Thyroid dysfunction[ ]   Home Medications Prior to Admission medications   Not on File    Past Medical History: Past Medical History:  Diagnosis Date  . Congenital heart disease    Epstein anomaly s/p repair 2002    Past Surgical History: Past Surgical History:  Procedure Laterality Date  . Epstein anomaly repair  06/2000    Family History: History reviewed. No pertinent family history.  Social History: Social History   Socioeconomic History  . Marital status: Single    Spouse name: Not on file  . Number of children: Not on file  . Years of education: Not  on file  . Highest education level: Not on file  Occupational History  . Occupation: [employer]  Tobacco Use  . Smoking status: Current Every Day Smoker    Packs/day: 1.00    Start date: 2005  . Smokeless tobacco: Never Used  Substance and Sexual Activity  . 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 Drug use: Not Currently    Comment: denies  . Sexual activity: Not on file  Other Topics Concern  . Not on file  Social History Narrative  . Not on file   Social Determinants of Health   Financial Resource Strain:   . Difficulty of Paying Living Expenses: Not on file  Food Insecurity:   .  Worried About Programme researcher, broadcasting/film/video in the Last Year: Not on file  . Ran Out of Food in the Last Year: Not on file  Transportation Needs:   . Lack of Transportation (Medical): Not on file  . Lack of Transportation (Non-Medical): Not on file  Physical Activity:   . Days of Exercise per Week: Not on file  . Minutes of Exercise per Session: Not on file  Stress:   . Feeling of Stress : Not on file  Social Connections:   . Frequency of Communication with Friends and Family: Not on file  . Frequency of Social Gatherings with Friends and Family: Not on file  . Attends Religious Services: Not on file  . Active Member of Clubs or Organizations: Not on file  . Attends Banker Meetings: Not on file  . Marital Status: Not on file    Allergies:  No Known Allergies  Objective:    Vital Signs:   Temp:  [97.5 F (36.4 C)-98 F (36.7 C)] 98 F (36.7 C) (09/15 0527) Pulse Rate:  [20-174] 143 (09/15 1300) Resp:  [11-29] 19 (09/15 1300) BP: (85-130)/(25-118) 105/73 (09/15 1300) SpO2:  [91 %-100 %] 91 % (09/15 1300)    Weight change: Filed Weights   02/06/20 0949  Weight: 69.5 kg    Intake/Output:   Intake/Output Summary (Last 24 hours) at 02/07/2020 1621 Last data filed at 02/06/2020 1657 Gross per 24 hour  Intake 100 ml  Output --  Net 100 ml      Physical Exam    General: Sitting up in bed o resp difficulty HEENT: normal Neck: supple. JVP to ear. Carotids 2+ bilat; no bruits. No lymphadenopathy or thyromegaly appreciated. Cor: PMI nondisplaced. Regular tachy + TR Lungs: clear Abdomen: soft, + RUQ tender, + distended. + pulsatile liver. No bruits or masses. Good bowel sounds. Extremities: no cyanosis, clubbing, rash, edema + cool Neuro: alert & orientedx3, cranial nerves grossly intact. moves all 4 extremities w/o difficulty. Affect pleasant   Telemetry   Atrial flutter  EKG    Atrial flutter 160 Personally reviewed  Labs   Basic Metabolic Panel: Recent  Labs  Lab 02/06/20 1025 02/07/20 0531 02/07/20 1333  NA 133* 134* 135  K 4.2 6.0* 5.4*  CL 96* 100 109  CO2 24 15* 14*  GLUCOSE 113* 25* 91  BUN 12 22* 22*  CREATININE 0.93 1.82* 1.65*  CALCIUM 9.0 8.1* 5.9*    Liver Function Tests: Recent Labs  Lab 02/06/20 1025 02/07/20 0531 02/07/20 1333  AST 801* >10,000* PENDING  ALT 566* 4,002* PENDING  ALKPHOS 54 60 43  BILITOT 1.7* 2.7* 2.1*  PROT 7.3  7.4 5.2*  ALBUMIN 3.7 3.6 2.4*   Recent Labs  Lab 02/06/20 1025  LIPASE 29   No results for input(s): AMMONIA in the last 168 hours.  CBC: Recent Labs  Lab 02/06/20 1025 02/07/20 1333  WBC 5.9 14.1*  NEUTROABS 4.3  --   HGB 12.7* 10.9*  HCT 38.7* 36.1*  MCV 90.0 97.3  PLT 218 145*    Cardiac Enzymes: No results for input(s): CKTOTAL, CKMB, CKMBINDEX, TROPONINI in the last 168 hours.  BNP: BNP (last 3 results) No results for input(s): BNP in the last 8760 hours.  ProBNP (last 3 results) No results for input(s): PROBNP in the last 8760 hours.   CBG: Recent Labs  Lab 02/07/20 0744 02/07/20 0840  GLUCAP 53* 135*    Coagulation Studies: No results for input(s): LABPROT, INR in the last 72 hours.   Imaging    No results found.   Medications:     Current Medications: . digoxin  0.25 mg Intravenous Q2H   Followed by  . [START ON 02/08/2020] digoxin  0.125 mg Intravenous Daily  . heparin injection (subcutaneous)  5,000 Units Subcutaneous Q8H  . metoprolol tartrate  5 mg Intravenous Q6H  . nicotine  14 mg Transdermal Daily  . sodium bicarbonate  50 mEq Intravenous Once     Infusions: . cefTRIAXone (ROCEPHIN)  IV 2 g (02/07/20 1543)  . dextrose          Assessment/Plan   1. 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 Ebstein anomaly s/p repair with severe TR and to-fro flow in hepatic veins - baseline RV function  and degree of TR unclear suspect decompensation related to AFL  - now with shock liver and kidney - despite severe transaminitis will need IV amio to help control rhythm - start milrinone for shock - place central access to monitor co-ox and CVP   2. Atrial flutter with RVR - start IV amio and heparin - eventual TEE/DC-CV (if not more emergent) - Dr. Addison Lank has discussed with EP and seems to be 1:1 RA circuit - will eventually need TEE/DC-CV (vs ablation)  if does not convert with amio   3. AKI - due to shock - will support hemodynamics  4. Shock liver - due to cardiogenic shock hepatitis panels negative  5. Hyperkalemia - lokelma - treat shock  CRITICAL CARE Performed by: Arvilla Meres  Total critical care time: 60 minutes  Critical care time was exclusive of separately billable procedures and treating other patients.  Critical care was necessary to treat or prevent imminent or life-threatening deterioration.  Critical care was time spent personally by me (independent of midlevel providers or residents) on the following activities: development of treatment plan with patient and/or surrogate as well as nursing, discussions with consultants, evaluation of patient's response to treatment, examination of patient, obtaining history from patient or surrogate, ordering and performing treatments and interventions, ordering and review of laboratory studies, ordering and review of radiographic studies, pulse oximetry and re-evaluation of patient's condition.    Length of Stay: 1  Arvilla Meres, MD  02/07/2020, 4:21 PM  Advanced Heart Failure Team Pager [phone] (M-F; 7a - 4p)  Please contact [facility] for night-coverage after hours (4p -7a ) and weekends on [email or web address]

## 2020-02-07 NOTE — Progress Notes (Addendum)
Subjective: CC: Patient with tachycardia on the monitor in the 150's.  He complains of generalized abdominal pain, worse in the epigastrium and ruq. Had some vomiting a couple hours ago. No nausea.  Denies chest pain, sob.   Objective: Vital signs in last 24 hours: Temp:  [97.5 F (36.4 C)-98 F (36.7 C)] 98 F (36.7 C) (09/15 0527) Pulse Rate:  [20-174] 143 (09/15 1300) Resp:  [11-29] 19 (09/15 1300) BP: (85-130)/(25-118) 105/73 (09/15 1300) SpO2:  [91 %-100 %] 91 % (09/15 1300)    Intake/Output from previous day: 09/14 0701 - 09/15 0700 In: 1100 [IV Piggyback:1100] Out: -  Intake/Output this shift: No intake/output data recorded.  PE: Gen:  Alert, NAD, pleasant HEENT: EOM's intact, pupils equal and round Card: Tachycardia in the 150's Pulm:  CTAB, no W/R/R, effort normal Abd: Soft, ND, generalized tenderness greatest in the LLQ and RUQ, +BS Ext:  No LE edema  Psych: A&Ox3  Skin: no rashes noted, warm and dry   Lab Results:  Recent Labs    02/06/20 1025  WBC 5.9  HGB 12.7*  HCT 38.7*  PLT 218   BMET Recent Labs    02/06/20 1025 02/07/20 0531  NA 133* 134*  K 4.2 6.0*  CL 96* 100  CO2 24 15*  GLUCOSE 113* 25*  BUN 12 22*  CREATININE 0.93 1.82*  CALCIUM 9.0 8.1*   PT/INR No results for input(s): LABPROT, INR in the last 72 hours. CMP     Component Value Date/Time   NA 134 (L) 02/07/2020 0531   K 6.0 (H) 02/07/2020 0531   CL 100 02/07/2020 0531   CO2 15 (L) 02/07/2020 0531   GLUCOSE 25 (LL) 02/07/2020 0531   BUN 22 (H) 02/07/2020 0531   CREATININE 1.82 (H) 02/07/2020 0531   CALCIUM 8.1 (L) 02/07/2020 0531   PROT 7.4 02/07/2020 0531   ALBUMIN 3.6 02/07/2020 0531   AST >10,000 (H) 02/07/2020 0531   ALT 4,002 (H) 02/07/2020 0531   ALKPHOS 60 02/07/2020 0531   BILITOT 2.7 (H) 02/07/2020 0531   GFRNONAA 47 (L) 02/07/2020 0531   GFRAA 55 (L) 02/07/2020 0531   Lipase     Component Value Date/Time   LIPASE 29 02/06/2020 1025        Studies/Results: DG Chest 2 View  Result Date: 02/06/2020 CLINICAL DATA:  Abdominal pain, tachycardia, congenital heart disease EXAM: CHEST - 2 VIEW COMPARISON:  None. FINDINGS: Lungs are clear. No pneumothorax or pleural effusion. There is moderate global cardiomegaly with particular enlargement of the right atrium. Median sternotomy has been performed. Widening of the right paratracheal stripe may relate to vascular shadow, particularly if there is history of prior shunt formation or inferior vena cava interruption. Lymphadenopathy may appear similarly. The pulmonary vascularity is normal. No acute bone abnormality. IMPRESSION: Moderate global cardiomegaly. Widening of the right paratracheal stripe, vascular shadow versus lymphadenopathy. This could be better assessed with CT imaging if prior examinations are unavailable for comparison. No radiographic evidence of acute cardiopulmonary disease. Electronically Signed   By: Helyn Numbers MD   On: 02/06/2020 15:20   CT Abdomen Pelvis W Contrast  Result Date: 02/06/2020 CLINICAL DATA:  34 year old male with history of abdominal distension. History of Ebstein's anomaly. EXAM: CT ABDOMEN AND PELVIS WITH CONTRAST TECHNIQUE: Multidetector CT imaging of the abdomen and pelvis was performed using the standard protocol following bolus administration of intravenous contrast. CONTRAST:  OMNIPAQUE IOHEXOL 300 MG/ML  SOLN COMPARISON:  No  priors. FINDINGS: Lower chest: Severe cardiomegaly with massively dilated right atrium. Hepatobiliary: Liver is enlarged measuring up to 19.8 cm in craniocaudal span. Diffuse low attenuation throughout the hepatic parenchyma, indicative of hepatic steatosis. Reflux of contrast material from the inferior vena cava through the hepatic veins, indicative of significant passive hepatic congestion. No discrete cystic or solid hepatic lesions. No intra or extrahepatic biliary ductal dilatation. Gallbladder is unremarkable in  appearance. Pancreas: No pancreatic mass. No pancreatic ductal dilatation. No pancreatic or peripancreatic fluid collections or inflammatory changes. Spleen: Unremarkable. Adrenals/Urinary Tract: Bilateral kidneys and adrenal glands are normal in appearance. No hydroureteronephrosis. Urinary bladder is nearly completely decompressed, and otherwise unremarkable in appearance. Stomach/Bowel: Normal appearance of the stomach. No pathologic dilatation of small bowel or colon. Normal appendix. Vascular/Lymphatic: No significant atherosclerotic disease, aneurysm or dissection noted in the abdominal or pelvic vasculature. No lymphadenopathy noted in the abdomen or pelvis. Reproductive: Prostate gland and seminal vesicles are unremarkable in appearance. Other: Small volume of ascites.  No pneumoperitoneum. Musculoskeletal: There are no aggressive appearing lytic or blastic lesions noted in the visualized portions of the skeleton. IMPRESSION: 1. Cardiomegaly with severe right atrial dilatation and reflux of contrast material into the liver with hepatomegaly, likely secondary to passive hepatic congestion. There is also evidence of hepatic steatosis and a small volume of ascites. 2. No other acute findings are noted in the abdomen or pelvis. Electronically Signed   By: Trudie Reed M.D.   On: 02/06/2020 12:57   US Abdomen Limited  Result Date: 02/06/2020 CLINICAL DATA:  Right upper quadrant pain EXAM: ULTRASOUND ABDOMEN LIMITED RIGHT UPPER QUADRANT COMPARISON:  CT abdomen and pelvis February 06, 2020 FINDINGS: Gallbladder: No gallstones are appreciable. The gallbladder wall is diffusely thickened and edematous with mild pericholecystic fluid. No sonographic Murphy sign noted by sonographer. Common bile duct: Diameter: 3 mm. Liver: No focal lesion identified. Within normal limits in parenchymal echogenicity. Portal vein is patent on color Doppler imaging with apparent bidirectional flow within the portal vein. Other:  There is mild ascites. IMPRESSION: 1. No gallstones evident. Gallbladder wall thickening with edema and pericholecystic fluid. While ascites may cause gallbladder wall thickening, the overall appearance of the gallbladder is concerning for acalculus cholecystitis. In this regard, it may be reasonable to consider nuclear medicine hepatobiliary imaging study to assess for cystic duct patency. 2.  Mild ascites. 3. Bidirectional flow in the portal vein. Portal vein appears patent. Electronically Signed   By: Bretta Bang III M.D.   On: 02/06/2020 14:27    Anti-infectives: Anti-infectives (From admission, onward)   Start     Dose/Rate Route Frequency Ordered Stop   02/07/20 1600  cefTRIAXone (ROCEPHIN) 2 g in sodium chloride 0.9 % 100 mL IVPB        2 g 200 mL/hr over 30 Minutes Intravenous Every 24 hours 02/06/20 1717     02/06/20 1615  cefTRIAXone (ROCEPHIN) 2 g in sodium chloride 0.9 % 100 mL IVPB        2 g 200 mL/hr over 30 Minutes Intravenous  Once 02/06/20 1602 02/06/20 1657       Assessment/Plan Cardiogenic Shock - Cards following. HR 150's +.  Cardiology's is recommending emergent cardioversion but feels there is no good antiarrhythmic choice if patient has recurrent arrhythmia.  They are going to temporize with IV digoxin.  They recommend ICU level care which I agree with.  It appears CCM been consulted.  Ebstein's anomaly with repair at age 23 AKI - Cr 0.93 >  1.82 Acute liver failure - AST >10,000 and ALT 4,002. T bili up from 1.7 to 2.7 today. Hepatitis panel is negative. Tylenol level < 10. Cards believes 2/2 hypoperfusion/low cardiac output. Hyperkalemia - K 6.0 Hypoglycemia   RUQ abdominal pain, n/v Possible Acalculous Cholecystitis - I suspect patient's gallbladder wall thickening and pericholecystic fluid on ultrasound were secondary to hepatic congestion/ascites.  His elevated LFTs likely are related to his current cardiogenic shock.  However, we will obtain a HIDA scan to  rule out acalculus cholecystitis.  He can remain on antibiotics until results.  If HIDA is positive, he likely require perc chole tube by IR given his current state.   FEN - NPO VTE - SCDs, Lovenox  ID - Rocephin    LOS: 1 day    Jacinto Halim , [facility] 02/07/2020, 1:57 PM Please see Amion for pager number during day hours 7:00am-4:30pm

## 2020-02-07 NOTE — ED Notes (Signed)
Pt given cup of orange juice for CBG of 53

## 2020-02-07 NOTE — ED Notes (Addendum)
Date and time results received: 02/07/20 3:45 PM  (use smartphrase ".now" to insert current time)  Test: Calcium Critical Value:  5.9  Name of Provider Notified: Dewald  Orders Received? Or Actions Taken?: Orders Received - See Orders for details

## 2020-02-07 NOTE — Progress Notes (Signed)
PROGRESS NOTE  Perry Cook ZOX:[id number] DOB: 05/20/1986 DOA: 02/06/2020 PCP: Patient, No Pcp Per  HPI/Recap of past 24 hours: Patient is a 34 year old male with past medical history significant for Epstein's anomaly but has not really seen a cardiologist in almost 10 years presented with abdominal pain, most notably in the right upper quadrant that had started a month ago along with some mild weight loss, shortness of breath and lower extremity edema.  Patient states that he has noticed his heart racing up to as high as 170 bpm at work or at night when he is trying to sleep.  In the emergency room, noted to have significant transaminitis and SVT.  A CT scan of the abdomen noted to have more consistent with hepatic congestion from volume overload and an echocardiogram has been ordered.  This morning, patient's heart rate still staying elevated in the 150s to 170s despite beta-blocker.  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  Patient is currently waiting to go to an ICU bed.  He states he is doing okay and just feels very tired.  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  Appreciate cardiology help.  Awaiting for echocardiogram.  Beta-blocker only intermittently helping with heart rate and SVT.  Waiting for ICU bed at [facility] Active Problems:    SVT (supraventricular tachycardia) (HCC): Beta-blocker only intermittently helping with heart rate/SVT.?  Cardizem drip, waiting for echocardiogram   Abdominal pain/transaminitis: Secondary to hepatic congestion from right-sided heart failure.    Tobacco dependence: Counseled to quit.  Code Status: Full code  Family Communication: No emergency contacts on file, patient declined for me to call  anyone  Disposition Plan: Waiting for transfer to ICU at [facility]   Consultants:  Cardiology  General surgery  Procedures:  Echocardiogram results pending  Antimicrobials:  None  DVT prophylaxis: Lovenox   Objective: Vitals:   02/07/20 1030 02/07/20 1300  BP: 103/81 105/73  Pulse: (!) 154 (!) 143  Resp: 17 19  Temp:    SpO2: 93% 91%    Intake/Output Summary (Last 24 hours) at 02/07/2020 1447 Last data filed at 02/06/2020 1657 Gross per 24 hour  Intake 100 ml  Output --  Net 100 ml   Filed Weights   02/06/20 0949  Weight: 69.5 kg   Body mass index is 20.77 kg/m.  Exam:   General: Alert and oriented x3, fatigued, no acute distress  HEENT: Normocephalic and atraumatic, mucous membranes are dry  Neck: Supple, mild JVD  Cardiovascular: Significantly tachycardic, regular rhythm  Respiratory: Decreased breath sounds bibasilar  Abdomen: Soft, mild distention, some tenderness pressure in the right upper quadrant, hypoactive bowel sounds  Musculoskeletal: No clubbing or cyanosis, 1+ pitting edema from the knees down bilaterally  Skin: No skin break, tears or lesions  Neuro: No focal deficits  Psychiatry: Appropriate, no evidence of psychoses   Data Reviewed: CBC: Recent Labs  Lab 02/06/20 1025  WBC 5.9  NEUTROABS 4.3  HGB 12.7*  HCT 38.7*  MCV 90.0  PLT 218   Basic Metabolic Panel: Recent Labs  Lab 02/06/20 1025 02/07/20 0531  NA 133* 134*  K 4.2 6.0*  CL 96* 100  CO2 24 15*  GLUCOSE 113* 25*  BUN 12 22*  CREATININE 0.93 1.82*  CALCIUM 9.0 8.1*   GFR: Estimated  Creatinine Clearance: 56.2 mL/min (A) (by C-G formula based on SCr of 1.82 mg/dL (H)). Liver Function Tests: Recent Labs  Lab 02/06/20 1025 02/07/20 0531  AST 801* >10,000*  ALT 566* 4,002*  ALKPHOS 54 60  BILITOT 1.7* 2.7*  PROT 7.3 7.4  ALBUMIN 3.7 3.6   Recent Labs  Lab 02/06/20 1025  LIPASE 29   No results for input(s): AMMONIA in the last 168  hours. Coagulation Profile: No results for input(s): INR, PROTIME in the last 168 hours. Cardiac Enzymes: No results for input(s): CKTOTAL, CKMB, CKMBINDEX, TROPONINI in the last 168 hours. BNP (last 3 results) No results for input(s): PROBNP in the last 8760 hours. HbA1C: No results for input(s): HGBA1C in the last 72 hours. CBG: Recent Labs  Lab 02/07/20 0744 02/07/20 0840  GLUCAP 53* 135*   Lipid Profile: No results for input(s): CHOL, HDL, LDLCALC, TRIG, CHOLHDL, LDLDIRECT in the last 72 hours. Thyroid Function Tests: No results for input(s): TSH, T4TOTAL, FREET4, T3FREE, THYROIDAB in the last 72 hours. Anemia Panel: No results for input(s): VITAMINB12, FOLATE, FERRITIN, TIBC, IRON, RETICCTPCT in the last 72 hours. Urine analysis:    Component Value Date/Time   COLORURINE YELLOW 02/06/2020 1757   APPEARANCEUR CLEAR 02/06/2020 1757   LABSPEC 1.010 02/06/2020 1757   PHURINE 5.5 02/06/2020 1757   GLUCOSEU NEGATIVE 02/06/2020 1757   HGBUR NEGATIVE 02/06/2020 1757   BILIRUBINUR SMALL (A) 02/06/2020 1757   KETONESUR NEGATIVE 02/06/2020 1757   PROTEINUR 100 (A) 02/06/2020 1757   NITRITE NEGATIVE 02/06/2020 1757   LEUKOCYTESUR NEGATIVE 02/06/2020 1757   Sepsis Labs: @LABRCNTIP (procalcitonin:4,lacticidven:4)  ) Recent Results (from the past 240 hour(s))  SARS Coronavirus 2 by RT PCR (hospital order, performed in [facility] hospital lab) Nasopharyngeal Nasopharyngeal Swab     Status: None   Collection Time: 02/06/20 10:13 AM   Specimen: Nasopharyngeal Swab  Result Value Ref Range Status   SARS Coronavirus 2 NEGATIVE NEGATIVE Final    Comment: (NOTE) SARS-CoV-2 target nucleic acids are NOT DETECTED.  The SARS-CoV-2 RNA is generally detectable in upper and lower respiratory specimens during the acute phase of infection. The lowest concentration of SARS-CoV-2 viral copies this assay can detect is 250 copies / mL. A negative result does not preclude SARS-CoV-2  infection and should not be used as the sole basis for treatment or other patient management decisions.  A negative result may occur with improper specimen collection / handling, submission of specimen other than nasopharyngeal swab, presence of viral mutation(s) within the areas targeted by this assay, and inadequate number of viral copies (<250 copies / mL). A negative result must be combined with clinical observations, patient history, and epidemiological information.  Fact Sheet for Patients:   02/08/20  Fact Sheet for Healthcare Providers: BoilerBrush.com.cy  This test is not yet approved or  cleared by the https://pope.com/ FDA and has been authorized for detection and/or diagnosis of SARS-CoV-2 by FDA under an Emergency Use Authorization (EUA).  This EUA will remain in effect (meaning this test can be used) for the duration of the COVID-19 declaration under Section 564(b)(1) of the Act, 21 U.S.C. section 360bbb-3(b)(1), unless the authorization is terminated or revoked sooner.  Performed at [facility], [address], [place], [place]       Studies: DG Chest 2 View  Result Date: 02/06/2020 CLINICAL DATA:  Abdominal pain, tachycardia, congenital heart disease EXAM: CHEST - 2 VIEW COMPARISON:  None. FINDINGS: Lungs are clear. No pneumothorax or pleural effusion.  There is moderate global cardiomegaly with particular enlargement of the right atrium. Median sternotomy has been performed. Widening of the right paratracheal stripe may relate to vascular shadow, particularly if there is history of prior shunt formation or inferior vena cava interruption. Lymphadenopathy may appear similarly. The pulmonary vascularity is normal. No acute bone abnormality. IMPRESSION: Moderate global cardiomegaly. Widening of the right paratracheal stripe, vascular shadow versus lymphadenopathy. This could be better  assessed with CT imaging if prior examinations are unavailable for comparison. No radiographic evidence of acute cardiopulmonary disease. Electronically Signed   By: Helyn Numbers MD   On: 02/06/2020 15:20    Scheduled Meds: . digoxin  0.25 mg Intravenous Q2H   Followed by  . [START ON 02/08/2020] digoxin  0.125 mg Intravenous Daily  . enoxaparin (LOVENOX) injection  40 mg Subcutaneous Q24H  . furosemide  40 mg Intravenous Q12H  . metoprolol tartrate  5 mg Intravenous Q6H  . nicotine  14 mg Transdermal Daily    Continuous Infusions: . cefTRIAXone (ROCEPHIN)  IV    . lactated ringers 75 mL/hr at 02/07/20 0654     LOS: 1 day     Hollice Espy, MD [facility]   02/07/2020, 2:47 PM

## 2020-02-07 NOTE — Progress Notes (Signed)
ANTICOAGULATION CONSULT NOTE - Initial Consult  Pharmacy Consult for Heparin Indication: Atrial flutter with RVR  No Known Allergies  Patient Measurements: Height: 6' (182.9 cm) Weight: 69.5 kg (153 lb 2 oz) IBW/kg (Calculated) : 77.6  Vital Signs: Temp: 98.8 F (37.1 C) (09/15 1747) Temp Source: Oral (09/15 1747) BP: 101/77 (09/15 1800) Pulse Rate: 79 (09/15 1800)  Labs: Recent Labs    02/06/20 1025 02/07/20 0531 02/07/20 1333 02/07/20 1450  HGB 12.7*  --  10.9*  --   HCT 38.7*  --  36.1*  --   PLT 218  --  145*  --   CREATININE 0.93 1.82* 1.65*  --   TROPONINIHS  --   --   --  22*    Estimated Creatinine Clearance: 62 mL/min (A) (by C-G formula based on SCr of 1.65 mg/dL (H)).   Medical History: Past Medical History:  Diagnosis Date  . Congenital heart disease    Epstein anomaly s/p repair 2002    Assessment: 34 year old malewith a hx of Ebstein's anomaly syndrome s/p repair 2011. Patient with atrial flutter with RVR in the setting of cardiogenic shock. Pharmacy consulted to initiate heparin therapy.  Patient was not on any anticoagulation PTA.  Goal of Therapy:  Heparin level 0.3-0.7 units/ml Monitor platelets by anticoagulation protocol: Yes   Plan:  Give 3500 units bolus x 1 Start heparin infusion at 1050 units/hr Check anti-Xa level in 6 hours and daily while on heparin Continue to monitor H&H and platelets  Jeanella Cara, PharmD, [facility] Clinical Pharmacist Please see AMION for all Pharmacists' Contact Phone Numbers 02/07/2020, 7:18 PM

## 2020-02-07 NOTE — ED Notes (Signed)
Critical Value: Lactic Acid 7.2 received at 1536 Paged the provider to notify

## 2020-02-07 NOTE — Progress Notes (Addendum)
Progress Note  Patient Name: Perry FewJoshua Vetsch Date of Encounter: 02/07/2020  [facility] Cardiologist: Thurmon FairMihai Boots Mcglown, MD new  Subjective   Pt seen in ER resting comfortably, appears to be tolerating rate and rhythm for now. Continues to complain of abdominal pain.   Inpatient Medications    Scheduled Meds: . enoxaparin (LOVENOX) injection  40 mg Subcutaneous Q24H  . furosemide  40 mg Intravenous Q12H  . ketorolac  30 mg Intravenous Q6H  . metoprolol tartrate  12.5 mg Oral BID  . nicotine  14 mg Transdermal Daily   Continuous Infusions: . cefTRIAXone (ROCEPHIN)  IV    . lactated ringers 75 mL/hr at 02/07/20 0654   PRN Meds: acetaminophen **OR** acetaminophen, ketorolac **FOLLOWED BY** [START ON 02/11/2020] ketorolac, metoprolol tartrate, morphine injection, ondansetron **OR** ondansetron (ZOFRAN) IV   Vital Signs    Vitals:   02/07/20 0500 02/07/20 0527 02/07/20 0539 02/07/20 0649  BP: 104/82 107/80 110/90 111/84  Pulse: (!) 154 (!) 150 (!) 151 (!) 150  Resp: 14 16 18  (!) 21  Temp:  98 F (36.7 C)    TempSrc:  Oral    SpO2: 93%  95% 100%  Weight:      Height:        Intake/Output Summary (Last 24 hours) at 02/07/2020 0836 Last data filed at 02/06/2020 1657 Gross per 24 hour  Intake 1100 ml  Output --  Net 1100 ml   Last 3 Weights 02/06/2020  Weight (lbs) 153 lb 2 oz  Weight (kg) 69.457 kg      Telemetry    Appears to be atrial flutter with rates in the 150-160s - Personally Reviewed  ECG    2:1 atrial flutter with ventricular rate 161, iRBBB, LAFB - Personally Reviewed  Physical Exam   GEN: No acute distress.   Neck: No JVD Cardiac: regular rhythm, tachycardic rate, possible soft murmur  Respiratory: Clear to auscultation bilaterally. GI: Soft, nontender, non-distended  MS: No edema; No deformity. Neuro:  Nonfocal  Psych: Normal affect   Labs    High Sensitivity Troponin:  No results for input(s): TROPONINIHS in the last 720 hours.     Chemistry Recent Labs  Lab 02/06/20 1025 02/07/20 0531  NA 133* 134*  K 4.2 6.0*  CL 96* 100  CO2 24 15*  GLUCOSE 113* 25*  BUN 12 22*  CREATININE 0.93 1.82*  CALCIUM 9.0 8.1*  PROT 7.3  --   ALBUMIN 3.7  --   AST 801*  --   ALT 566*  --   ALKPHOS 54  --   BILITOT 1.7*  --   GFRNONAA >60 47*  GFRAA >60 55*  ANIONGAP 13 19*     Hematology Recent Labs  Lab 02/06/20 1025  WBC 5.9  RBC 4.30  HGB 12.7*  HCT 38.7*  MCV 90.0  MCH 29.5  MCHC 32.8  RDW 15.8*  PLT 218    BNPNo results for input(s): BNP, PROBNP in the last 168 hours.   DDimer No results for input(s): DDIMER in the last 168 hours.   Radiology    DG Chest 2 View  Result Date: 02/06/2020 CLINICAL DATA:  Abdominal pain, tachycardia, congenital heart disease EXAM: CHEST - 2 VIEW COMPARISON:  None. FINDINGS: Lungs are clear. No pneumothorax or pleural effusion. There is moderate global cardiomegaly with particular enlargement of the right atrium. Median sternotomy has been performed. Widening of the right paratracheal stripe may relate to vascular shadow, particularly if there is history of prior  shunt formation or inferior vena cava interruption. Lymphadenopathy may appear similarly. The pulmonary vascularity is normal. No acute bone abnormality. IMPRESSION: Moderate global cardiomegaly. Widening of the right paratracheal stripe, vascular shadow versus lymphadenopathy. This could be better assessed with CT imaging if prior examinations are unavailable for comparison. No radiographic evidence of acute cardiopulmonary disease. Electronically Signed   By: Helyn Numbers MD   On: 02/06/2020 15:20   CT Abdomen Pelvis W Contrast  Result Date: 02/06/2020 CLINICAL DATA:  34 year old male with history of abdominal distension. History of Ebstein's anomaly. EXAM: CT ABDOMEN AND PELVIS WITH CONTRAST TECHNIQUE: Multidetector CT imaging of the abdomen and pelvis was performed using the standard protocol following bolus  administration of intravenous contrast. CONTRAST:  OMNIPAQUE IOHEXOL 300 MG/ML  SOLN COMPARISON:  No priors. FINDINGS: Lower chest: Severe cardiomegaly with massively dilated right atrium. Hepatobiliary: Liver is enlarged measuring up to 19.8 cm in craniocaudal span. Diffuse low attenuation throughout the hepatic parenchyma, indicative of hepatic steatosis. Reflux of contrast material from the inferior vena cava through the hepatic veins, indicative of significant passive hepatic congestion. No discrete cystic or solid hepatic lesions. No intra or extrahepatic biliary ductal dilatation. Gallbladder is unremarkable in appearance. Pancreas: No pancreatic mass. No pancreatic ductal dilatation. No pancreatic or peripancreatic fluid collections or inflammatory changes. Spleen: Unremarkable. Adrenals/Urinary Tract: Bilateral kidneys and adrenal glands are normal in appearance. No hydroureteronephrosis. Urinary bladder is nearly completely decompressed, and otherwise unremarkable in appearance. Stomach/Bowel: Normal appearance of the stomach. No pathologic dilatation of small bowel or colon. Normal appendix. Vascular/Lymphatic: No significant atherosclerotic disease, aneurysm or dissection noted in the abdominal or pelvic vasculature. No lymphadenopathy noted in the abdomen or pelvis. Reproductive: Prostate gland and seminal vesicles are unremarkable in appearance. Other: Small volume of ascites.  No pneumoperitoneum. Musculoskeletal: There are no aggressive appearing lytic or blastic lesions noted in the visualized portions of the skeleton. IMPRESSION: 1. Cardiomegaly with severe right atrial dilatation and reflux of contrast material into the liver with hepatomegaly, likely secondary to passive hepatic congestion. There is also evidence of hepatic steatosis and a small volume of ascites. 2. No other acute findings are noted in the abdomen or pelvis. Electronically Signed   By: Trudie Reed M.D.   On: 02/06/2020  12:57   US Abdomen Limited  Result Date: 02/06/2020 CLINICAL DATA:  Right upper quadrant pain EXAM: ULTRASOUND ABDOMEN LIMITED RIGHT UPPER QUADRANT COMPARISON:  CT abdomen and pelvis February 06, 2020 FINDINGS: Gallbladder: No gallstones are appreciable. The gallbladder wall is diffusely thickened and edematous with mild pericholecystic fluid. No sonographic Murphy sign noted by sonographer. Common bile duct: Diameter: 3 mm. Liver: No focal lesion identified. Within normal limits in parenchymal echogenicity. Portal vein is patent on color Doppler imaging with apparent bidirectional flow within the portal vein. Other: There is mild ascites. IMPRESSION: 1. No gallstones evident. Gallbladder wall thickening with edema and pericholecystic fluid. While ascites may cause gallbladder wall thickening, the overall appearance of the gallbladder is concerning for acalculus cholecystitis. In this regard, it may be reasonable to consider nuclear medicine hepatobiliary imaging study to assess for cystic duct patency. 2.  Mild ascites. 3. Bidirectional flow in the portal vein. Portal vein appears patent. Electronically Signed   By: Bretta Bang III M.D.   On: 02/06/2020 14:27    Cardiac Studies   Echo 02/06/20: Final read pending  Patient Profile     34 y.o. male with history of Ebstein's anomaly syndrome with repair in 2011 presented with  RUQ pain found to be in SVT/atrial flutter.  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 - echo pending read - question role of tricuspid valve insufficiency - continue with lasix 40 mg IV BID - CO2 15 - will discuss with attending need for ABG and coox - consider consult to advanced heart failure   Atrial flutter with RVR, iRBBB, LAFB - did not convert with adenosine x 3 - converted to sinus rhythm with 2.5 mg lopressor IV x 2 - started on PO BB - ordered 5 mg IV lopressor q6 hr for rate control   Ebstein's anomaly  syndrome s/p repair in 2011 - echo pending - continue diuresis   RUQ pain - elevated LFTs - recheck this morning (labs added on) - concern for possible cholecystitis - will need better rate control - suspect elevated LFTs are related to acute illness, rather than chronic liver abnormality - hepatitis panel negative   AKI Hyperkalemia - sCr today 1.86 (0.93) - K 6.0 - has received 40 mg IV lasix this morning - plan to recheck BMP this afternoon   [facility]ing for [facility] for [facility] bed.     For questions or updates, please contact [facility] Please consult [email or web address] for contact info under        Signed, Marcelino Duster, PA  02/07/2020, 8:36 AM    I have seen and examined the patient along with Marcelino Duster, PA.  I have reviewed the chart, notes and new data.  I agree with their note.  Key new complaints: he is quite stoic, complains of abdominal pain, no chest pain or angina. Key examination changes: back in persistent atrial flutter with 2:1 AV block, SBP 90s-100s, tender pulsatile hepatomegaly Key new findings / data: drastic worsening of transaminases and renal parameters. Viral hepatitis studies are negative, Bidirectional flow in the portal vein.    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 Rate control limited by hypotension. The initial increase in transaminases was probably due to severe passive congestion, but the sudden severe increase coupled with the acute renal insufficiency is consistent with shock liver/kidney due to hypoperfusion/low cardiac output. Considering emergency cardioversion, but he is likely to have recurrent arrhythmia without antiarrhythmic therapy and there is no good antiarrhythmic choice (amio w shock liver, flecainide w CHF/low RVEF and low LVEF, dofetilide with rapidly worsening renal function). D/W Dr. Graciela Husbands, will try  to temporize with iv digoxin. He will require inotropes for support, which will just further increase the tendency to tachyarrhythmia. He really needs ICU level care, central line monitoring, pressors and advanced heart failure services (long-term his only option is probably heart transplantation for end-stage RV failure). Also discussed care w Dr. Gala Romney. Will enlist the help of the [facility] PCCM service until we can get the most appropriate location for care.  CRITICAL CARE Performed by: Rachelle Hora Talib Headley   Total critical care time: 65 minutes  Critical care time was exclusive of separately billable procedures and treating other patients.  Critical care was necessary to treat or prevent imminent or life-threatening deterioration.  Critical care was time spent personally by me on the following activities: development of treatment plan with patient and/or surrogate as well as nursing, discussions with consultants, evaluation of patient's response to treatment, examination of patient, obtaining history from patient or surrogate, ordering and performing treatments and interventions, ordering and  review of laboratory studies, ordering and review of radiographic studies, pulse oximetry and re-evaluation of patient's condition.   Thurmon Fair, MD, [facility] [phone] 02/07/2020, 1:39 PM

## 2020-02-07 NOTE — ED Notes (Signed)
[facility] en route.  °

## 2020-02-08 ENCOUNTER — Inpatient Hospital Stay (HOSPITAL_COMMUNITY): Payer: Self-pay

## 2020-02-08 DIAGNOSIS — M7989 Other specified soft tissue disorders: Secondary | ICD-10-CM

## 2020-02-08 DIAGNOSIS — R570 Cardiogenic shock: Secondary | ICD-10-CM

## 2020-02-08 LAB — COOXEMETRY PANEL
Carboxyhemoglobin: 0.9 % (ref 0.5–1.5)
Carboxyhemoglobin: 1.3 % (ref 0.5–1.5)
Methemoglobin: 0.6 % (ref 0.0–1.5)
Methemoglobin: 1 % (ref 0.0–1.5)
O2 Saturation: 58 %
O2 Saturation: 54.4 %
Total hemoglobin: 15.1 g/dL (ref 12.0–16.0)
Total hemoglobin: 12.8 g/dL (ref 12.0–16.0)

## 2020-02-08 LAB — HEPARIN LEVEL (UNFRACTIONATED)
Heparin Unfractionated: 0.11 IU/mL — ABNORMAL LOW (ref 0.30–0.70)
Heparin Unfractionated: 0.16 IU/mL — ABNORMAL LOW (ref 0.30–0.70)
Heparin Unfractionated: 0.29 IU/mL — ABNORMAL LOW (ref 0.30–0.70)

## 2020-02-08 LAB — ECHOCARDIOGRAM COMPLETE
Area-P 1/2: 4.15 cm2
Calc EF: 40.9 %
Height: 72 in
MV M vel: 3.85 m/s
MV Peak grad: 59.3 mmHg
Radius: 0.3 cm
S' Lateral: 3.79 cm
Single Plane A2C EF: 41 %
Single Plane A4C EF: 39.9 %
Weight: 2450 oz

## 2020-02-08 LAB — COMPREHENSIVE METABOLIC PANEL
ALT: 3691 U/L — ABNORMAL HIGH (ref 0–44)
AST: 8423 U/L — ABNORMAL HIGH (ref 15–41)
Albumin: 2.9 g/dL — ABNORMAL LOW (ref 3.5–5.0)
Alkaline Phosphatase: 60 U/L (ref 38–126)
Anion gap: 13 (ref 5–15)
BUN: 31 mg/dL — ABNORMAL HIGH (ref 6–20)
CO2: 24 mmol/L (ref 22–32)
Calcium: 8.1 mg/dL — ABNORMAL LOW (ref 8.9–10.3)
Chloride: 96 mmol/L — ABNORMAL LOW (ref 98–111)
Creatinine, Ser: 1.98 mg/dL — ABNORMAL HIGH (ref 0.61–1.24)
GFR calc Af Amer: 50 mL/min — ABNORMAL LOW (ref >60)
GFR calc non Af Amer: 43 mL/min — ABNORMAL LOW (ref >60)
Glucose, Bld: 110 mg/dL — ABNORMAL HIGH (ref 70–99)
Potassium: 4.1 mmol/L (ref 3.5–5.1)
Sodium: 133 mmol/L — ABNORMAL LOW (ref 135–145)
Total Bilirubin: 2.2 mg/dL — ABNORMAL HIGH (ref 0.3–1.2)
Total Protein: 6.1 g/dL — ABNORMAL LOW (ref 6.5–8.1)

## 2020-02-08 LAB — CBC
HCT: 36.1 % — ABNORMAL LOW (ref 39.0–52.0)
Hemoglobin: 11.6 g/dL — ABNORMAL LOW (ref 13.0–17.0)
MCH: 29.2 pg (ref 26.0–34.0)
MCHC: 32.1 g/dL (ref 30.0–36.0)
MCV: 90.9 fL (ref 80.0–100.0)
Platelets: 152 10*3/uL (ref 150–400)
RBC: 3.97 MIL/uL — ABNORMAL LOW (ref 4.22–5.81)
RDW: 15.6 % — ABNORMAL HIGH (ref 11.5–15.5)
WBC: 10.4 10*3/uL (ref 4.0–10.5)

## 2020-02-08 LAB — GLUCOSE, CAPILLARY
Glucose-Capillary: 105 mg/dL — ABNORMAL HIGH (ref 70–99)
Glucose-Capillary: 105 mg/dL — ABNORMAL HIGH (ref 70–99)
Glucose-Capillary: 117 mg/dL — ABNORMAL HIGH (ref 70–99)
Glucose-Capillary: 117 mg/dL — ABNORMAL HIGH (ref 70–99)
Glucose-Capillary: 121 mg/dL — ABNORMAL HIGH (ref 70–99)
Glucose-Capillary: 175 mg/dL — ABNORMAL HIGH (ref 70–99)

## 2020-02-08 LAB — LACTIC ACID, PLASMA: Lactic Acid, Venous: 2.2 mmol/L (ref 0.5–1.9)

## 2020-02-08 MED ORDER — POTASSIUM CHLORIDE CRYS ER 20 MEQ PO TBCR
40.0000 meq | EXTENDED_RELEASE_TABLET | Freq: Once | ORAL | Status: AC
  Administered 2020-02-08: 40 meq via ORAL
  Filled 2020-02-08: qty 2

## 2020-02-08 MED ORDER — FUROSEMIDE 10 MG/ML IJ SOLN
120.0000 mg | Freq: Two times a day (BID) | INTRAVENOUS | Status: AC
  Administered 2020-02-08 – 2020-02-11 (×8): 120 mg via INTRAVENOUS
  Filled 2020-02-08 (×4): qty 10
  Filled 2020-02-08: qty 2
  Filled 2020-02-08: qty 10
  Filled 2020-02-08: qty 12
  Filled 2020-02-08: qty 10
  Filled 2020-02-08: qty 2

## 2020-02-08 MED ORDER — ADULT MULTIVITAMIN W/MINERALS CH
1.0000 | ORAL_TABLET | Freq: Every day | ORAL | Status: DC
  Administered 2020-02-09 – 2020-02-18 (×10): 1 via ORAL
  Filled 2020-02-08 (×10): qty 1

## 2020-02-08 MED ORDER — HEPARIN BOLUS VIA INFUSION
2000.0000 [IU] | Freq: Once | INTRAVENOUS | Status: AC
  Administered 2020-02-08: 2000 [IU] via INTRAVENOUS
  Filled 2020-02-08: qty 2000

## 2020-02-08 MED ORDER — ENSURE ENLIVE PO LIQD
237.0000 mL | Freq: Two times a day (BID) | ORAL | Status: DC
  Administered 2020-02-09 – 2020-02-17 (×13): 237 mL via ORAL

## 2020-02-08 NOTE — Progress Notes (Signed)
[facility]Link notified of temp of 101, due to elevated liver enzymes patient unable to take tylenol and due to elevated bun/Creat patient unable to take motrin, will attempt ice packs.

## 2020-02-08 NOTE — Progress Notes (Signed)
Initial Nutrition Assessment  DOCUMENTATION CODES:   Severe malnutrition in context of acute illness/injury  INTERVENTION:    Ensure Enlive po BID, each supplement provides 350 kcal and 20 grams of protein  MVI daily   NUTRITION DIAGNOSIS:   Severe Malnutrition related to acute illness (acute on chronic RHF) as evidenced by mild fat depletion, severe muscle depletion, energy intake < or equal to 50% for > or equal to 5 days  GOAL:   Patient will meet greater than or equal to 90% of their needs  MONITOR:   PO intake, Supplement acceptance, Weight trends, Labs, I & O's  REASON FOR ASSESSMENT:   Malnutrition Screening Tool    ASSESSMENT:   Patient with PMH significant for Ebstein's anomaly syndrome s/p repair in 2002. Presents this admission with cardiogenic shock 2/2 acute on chronic RHF.   Pt endorses a loss in appetite over the last month due to worsening abdominal pain and vomiting. States during this time he would attempt to three times daily but could only tolerate bites at a time. Meals mostly consisted of soup. Pt had lunch at bedside with a few bites taken. States this has been the first meal he's been able to keep down. Discussed the importance of protein intake for preservation of lean body mass. Pt willing to try supplements.   Pt endorses a UBW of 150 lb and a recent wt loss of an unknown amount. Records lack weight history over the last year.   UOP: 1,775 ml x 24 hrs   Drips: 120 mg lasix in D5 Labs: Na 133 (L) Cr 1.98-down from yesterday LFTs elevated   NUTRITION - FOCUSED PHYSICAL EXAM:    Most Recent Value  Orbital Region Mild depletion  Upper Arm Region Mild depletion  Thoracic and Lumbar Region Unable to assess  Buccal Region Mild depletion  Temple Region Mild depletion  Clavicle Bone Region Moderate depletion  Clavicle and Acromion Bone Region Moderate depletion  Scapular Bone Region Unable to assess  Dorsal Hand Mild depletion  Patellar Region  Severe depletion  Anterior Thigh Region Severe depletion  Posterior Calf Region Moderate depletion  Edema (RD Assessment) Mild  Hair Reviewed  Eyes Reviewed  Mouth Reviewed  Skin Reviewed  Nails Reviewed     Diet Order:   Diet Order            Diet Heart Room service appropriate? Yes; Fluid consistency: Thin; Fluid restriction: 1200 mL Fluid  Diet effective now                 EDUCATION NEEDS:   Education needs have been addressed  Skin:  Skin Assessment: Reviewed RN Assessment  Last BM:  PTA  Height:   Ht Readings from Last 1 Encounters:  02/06/20 6' (1.829 m)    Weight:   Wt Readings from Last 1 Encounters:  02/08/20 70.8 kg    BMI:  Body mass index is 21.17 kg/m.  Estimated Nutritional Needs:   Kcal:  2300-2500 kcal  Protein:  115-130 grams  Fluid:  >/= 2.3 L/day   Vanessa Kick RD, LDN Clinical Nutrition Pager listed in AMION

## 2020-02-08 NOTE — Progress Notes (Signed)
Patients daughters mother called several times requesting information regarding the patient and his condition. It was explained that she could not be given information via phone, however it would be passed along to the patient for him to call to update her at his discretion. She continued to call Delorise Shiner [phone]

## 2020-02-08 NOTE — Progress Notes (Signed)
ANTICOAGULATION CONSULT NOTE  Pharmacy Consult for Heparin Indication: Atrial flutter with RVR  No Known Allergies  Patient Measurements: Height: 6' (182.9 cm) Weight: 70.8 kg (156 lb 1.4 oz) IBW/kg (Calculated) : 77.6 Heparin dosing weight: 70 kg  Vital Signs: Temp: 101 F (38.3 C) (09/16 2000) Temp Source: Oral (09/16 2000) BP: 119/82 (09/16 2015) Pulse Rate: 69 (09/16 2100)  Labs: Recent Labs    02/06/20 1025 02/07/20 0531 02/07/20 1333 02/07/20 1450 02/07/20 2013 02/08/20 0435 02/08/20 0700 02/08/20 1230 02/08/20 2051  HGB 12.7*  --  10.9*  --   --  11.6*  --   --   --   HCT 38.7*  --  36.1*  --   --  36.1*  --   --   --   PLT 218  --  145*  --   --  152  --   --   --   HEPARINUNFRC  --   --   --   --   --  0.11*  --  0.16* 0.29*  CREATININE 0.93   < > 1.65*  --  2.28*  --  1.98*  --   --   TROPONINIHS  --   --   --  22*  --   --   --   --   --    < > = values in this interval not displayed.    Estimated Creatinine Clearance: 52.6 mL/min (A) (by C-G formula based on SCr of 1.98 mg/dL (H)).  Assessment: 34 year old malewith a hx of Ebstein's anomaly syndrome s/p repair 2011. Patient with atrial flutter with RVR in the setting of cardiogenic shock. Pharmacy consulted to initiate heparin therapy. Patient was not on any anticoagulation PTA.  Heparin level remains subtherapeutic but barely at 29.  Goal of Therapy:  Heparin level 0.3-0.7 units/ml Monitor platelets by anticoagulation protocol: Yes   Plan:  Increase heparin infusion to 1600 units/hr Daily hep lvl cbc  Elmer Sow, PharmD, BCPS, BCCCP Clinical Pharmacist [phone]  Please check AMION for all [facility] numbers  02/08/2020 9:28 PM

## 2020-02-08 NOTE — Progress Notes (Signed)
ANTICOAGULATION CONSULT NOTE - Follow Up Consult  Pharmacy Consult for heparin Indication: Aflutter  Labs: Recent Labs    02/06/20 1025 02/06/20 1025 02/07/20 0531 02/07/20 1333 02/07/20 1450 02/07/20 2013 02/08/20 0435  HGB 12.7*  --   --  10.9*  --   --   --   HCT 38.7*  --   --  36.1*  --   --   --   PLT 218  --   --  145*  --   --   --   HEPARINUNFRC  --   --   --   --   --   --  0.11*  CREATININE 0.93   < > 1.82* 1.65*  --  2.28*  --   TROPONINIHS  --   --   --   --  22*  --   --    < > = values in this interval not displayed.    Assessment: 34yo male subtherapeutic on heparin with initial dosing for Aflutter; no gtt issues or signs of bleeding per RN.  Goal of Therapy:  Heparin level 0.3-0.7 units/ml   Plan:  Will rebolus with heparin 2000 units and increase heparin gtt by 3 units/kg/hr to 1250 units/hr and check level in 8 hours.    Vernard Gambles, PharmD, BCPS  02/08/2020,5:58 AM

## 2020-02-08 NOTE — Progress Notes (Addendum)
General Surgery Follow Up Note  Subjective:    Overnight Issues:   Objective:  Vital signs for last 24 hours: Temp:  [98.8 F (37.1 C)-99.9 F (37.7 C)] 99.3 F (37.4 C) (09/16 0813) Pulse Rate:  [36-166] 55 (09/16 0630) Resp:  [11-34] 15 (09/16 0630) BP: (85-138)/(52-116) 102/56 (09/16 0630) SpO2:  [89 %-100 %] 94 % (09/16 0630) Weight:  [70.8 kg] 70.8 kg (09/16 0607)  Hemodynamic parameters for last 24 hours: CVP:  [18 mmHg-51 mmHg] 25 mmHg  Intake/Output from previous day: 09/15 0701 - 09/16 0700 In: 728.1 [I.V.:672.8; IV Piggyback:55.3] Out: 1775 [Urine:1775]  Intake/Output this shift: No intake/output data recorded.  Vent settings for last 24 hours:    Physical Exam:  Gen: comfortable, no distress Neuro: non-focal exam HEENT: PERRL Neck: supple CV: tachycardic Pulm: unlabored breathing Abd: soft, +hepatomegaly and RUQ TTP   Results for orders placed or performed during the hospital encounter of 02/06/20 (from the past 24 hour(s))  Lactic acid, plasma     Status: Abnormal   Collection Time: 02/07/20  1:32 PM  Result Value Ref Range   Lactic Acid, Venous 7.2 (HH) 0.5 - 1.9 mmol/L  CBC     Status: Abnormal   Collection Time: 02/07/20  1:33 PM  Result Value Ref Range   WBC 14.1 (H) 4.0 - 10.5 K/uL   RBC 3.71 (L) 4.22 - 5.81 MIL/uL   Hemoglobin 10.9 (L) 13.0 - 17.0 g/dL   HCT 36.1 (L) 39 - 52 %   MCV 97.3 80.0 - 100.0 fL   MCH 29.4 26.0 - 34.0 pg   MCHC 30.2 30.0 - 36.0 g/dL   RDW 16.4 (H) 11.5 - 15.5 %   Platelets 145 (L) 150 - 400 K/uL   nRBC 0.1 0.0 - 0.2 %  Comprehensive metabolic panel     Status: Abnormal   Collection Time: 02/07/20  1:33 PM  Result Value Ref Range   Sodium 135 135 - 145 mmol/L   Potassium 5.4 (H) 3.5 - 5.1 mmol/L   Chloride 109 98 - 111 mmol/L   CO2 14 (L) 22 - 32 mmol/L   Glucose, Bld 91 70 - 99 mg/dL   BUN 22 (H) 6 - 20 mg/dL   Creatinine, Ser 1.65 (H) 0.61 - 1.24 mg/dL   Calcium 5.9 (LL) 8.9 - 10.3 mg/dL   Total  Protein 5.2 (L) 6.5 - 8.1 g/dL   Albumin 2.4 (L) 3.5 - 5.0 g/dL   AST 8,536 (H) 15 - 41 U/L   ALT 3,023 (H) 0 - 44 U/L   Alkaline Phosphatase 43 38 - 126 U/L   Total Bilirubin 2.1 (H) 0.3 - 1.2 mg/dL   GFR calc non Af Amer 53 (L) >60 mL/min   GFR calc Af Amer >60 >60 mL/min   Anion gap 12 5 - 15  Urine rapid drug screen (hosp performed)     Status: None   Collection Time: 02/07/20  2:32 PM  Result Value Ref Range   Opiates NONE DETECTED NONE DETECTED   Cocaine NONE DETECTED NONE DETECTED   Benzodiazepines NONE DETECTED NONE DETECTED   Amphetamines NONE DETECTED NONE DETECTED   Tetrahydrocannabinol NONE DETECTED NONE DETECTED   Barbiturates NONE DETECTED NONE DETECTED  Troponin I (High Sensitivity)     Status: Abnormal   Collection Time: 02/07/20  2:50 PM  Result Value Ref Range   Troponin I (High Sensitivity) 22 (H) <18 ng/L  Brain natriuretic peptide     Status: Abnormal  Collection Time: 02/07/20  2:50 PM  Result Value Ref Range   B Natriuretic Peptide 862.2 (H) 0.0 - 100.0 pg/mL  TSH     Status: Abnormal   Collection Time: 02/07/20  3:00 PM  Result Value Ref Range   TSH 8.762 (H) 0.350 - 4.500 uIU/mL  MRSA PCR Screening     Status: None   Collection Time: 02/07/20  6:07 PM   Specimen: Nasal Mucosa; Nasopharyngeal  Result Value Ref Range   MRSA by PCR NEGATIVE NEGATIVE  .Cooxemetry Panel (carboxy, met, total hgb, O2 sat)     Status: None   Collection Time: 02/07/20  6:35 PM  Result Value Ref Range   Total hemoglobin 12.0 12.0 - 16.0 g/dL   O2 Saturation 46.1 %   Carboxyhemoglobin 0.8 0.5 - 1.5 %   Methemoglobin 0.7 0.0 - 1.5 %  Basic metabolic panel     Status: Abnormal   Collection Time: 02/07/20  8:13 PM  Result Value Ref Range   Sodium 130 (L) 135 - 145 mmol/L   Potassium 5.1 3.5 - 5.1 mmol/L   Chloride 97 (L) 98 - 111 mmol/L   CO2 20 (L) 22 - 32 mmol/L   Glucose, Bld 140 (H) 70 - 99 mg/dL   BUN 29 (H) 6 - 20 mg/dL   Creatinine, Ser 2.28 (H) 0.61 - 1.24 mg/dL     Calcium 8.0 (L) 8.9 - 10.3 mg/dL   GFR calc non Af Amer 36 (L) >60 mL/min   GFR calc Af Amer 42 (L) >60 mL/min   Anion gap 13 5 - 15  Lactic acid, plasma     Status: Abnormal   Collection Time: 02/07/20  8:13 PM  Result Value Ref Range   Lactic Acid, Venous 5.3 (HH) 0.5 - 1.9 mmol/L  .Cooxemetry Panel (carboxy, met, total hgb, O2 sat)     Status: None   Collection Time: 02/07/20  8:20 PM  Result Value Ref Range   Total hemoglobin 12.5 12.0 - 16.0 g/dL   O2 Saturation 45.5 %   Carboxyhemoglobin 1.1 0.5 - 1.5 %   Methemoglobin 1.1 0.0 - 1.5 %  Glucose, capillary     Status: Abnormal   Collection Time: 02/07/20  9:20 PM  Result Value Ref Range   Glucose-Capillary 107 (H) 70 - 99 mg/dL  Lactic acid, plasma     Status: Abnormal   Collection Time: 02/07/20 10:31 PM  Result Value Ref Range   Lactic Acid, Venous 4.7 (HH) 0.5 - 1.9 mmol/L  .Cooxemetry Panel (carboxy, met, total hgb, O2 sat)     Status: None   Collection Time: 02/07/20 10:31 PM  Result Value Ref Range   Total hemoglobin 12.2 12.0 - 16.0 g/dL   O2 Saturation 50.0 %   Carboxyhemoglobin 1.1 0.5 - 1.5 %   Methemoglobin 1.1 0.0 - 1.5 %  Glucose, capillary     Status: Abnormal   Collection Time: 02/07/20 11:54 PM  Result Value Ref Range   Glucose-Capillary 102 (H) 70 - 99 mg/dL  Glucose, capillary     Status: Abnormal   Collection Time: 02/08/20  4:01 AM  Result Value Ref Range   Glucose-Capillary 105 (H) 70 - 99 mg/dL  Heparin level (unfractionated)     Status: Abnormal   Collection Time: 02/08/20  4:35 AM  Result Value Ref Range   Heparin Unfractionated 0.11 (L) 0.30 - 0.70 IU/mL  CBC     Status: Abnormal   Collection Time: 02/08/20  4:35 AM  Result Value Ref Range   WBC 10.4 4.0 - 10.5 K/uL   RBC 3.97 (L) 4.22 - 5.81 MIL/uL   Hemoglobin 11.6 (L) 13.0 - 17.0 g/dL   HCT 36.1 (L) 39 - 52 %   MCV 90.9 80.0 - 100.0 fL   MCH 29.2 26.0 - 34.0 pg   MCHC 32.1 30.0 - 36.0 g/dL   RDW 15.6 (H) 11.5 - 15.5 %   Platelets  152 150 - 400 K/uL  .Cooxemetry Panel (carboxy, met, total hgb, O2 sat)     Status: None   Collection Time: 02/08/20  4:45 AM  Result Value Ref Range   Total hemoglobin 15.1 12.0 - 16.0 g/dL   O2 Saturation 58.0 %   Carboxyhemoglobin 0.9 0.5 - 1.5 %   Methemoglobin 0.6 0.0 - 1.5 %  Comprehensive metabolic panel     Status: Abnormal   Collection Time: 02/08/20  7:00 AM  Result Value Ref Range   Sodium 133 (L) 135 - 145 mmol/L   Potassium 4.1 3.5 - 5.1 mmol/L   Chloride 96 (L) 98 - 111 mmol/L   CO2 24 22 - 32 mmol/L   Glucose, Bld 110 (H) 70 - 99 mg/dL   BUN 31 (H) 6 - 20 mg/dL   Creatinine, Ser 1.98 (H) 0.61 - 1.24 mg/dL   Calcium 8.1 (L) 8.9 - 10.3 mg/dL   Total Protein 6.1 (L) 6.5 - 8.1 g/dL   Albumin 2.9 (L) 3.5 - 5.0 g/dL   AST 8,423 (H) 15 - 41 U/L   ALT 3,691 (H) 0 - 44 U/L   Alkaline Phosphatase 60 38 - 126 U/L   Total Bilirubin 2.2 (H) 0.3 - 1.2 mg/dL   GFR calc non Af Amer 43 (L) >60 mL/min   GFR calc Af Amer 50 (L) >60 mL/min   Anion gap 13 5 - 15  Lactic acid, plasma     Status: Abnormal   Collection Time: 02/08/20  7:00 AM  Result Value Ref Range   Lactic Acid, Venous 2.2 (HH) 0.5 - 1.9 mmol/L  Glucose, capillary     Status: Abnormal   Collection Time: 02/08/20  8:14 AM  Result Value Ref Range   Glucose-Capillary 105 (H) 70 - 99 mg/dL    Assessment & Plan: The plan of care was discussed with the bedside nurse for the day, who is in agreement with this plan and no additional concerns were raised.   Present on Admission: . Right heart failure ([place]) . SVT (supraventricular tachycardia) ([place]) . Abdominal pain . Tobacco dependence . Elevated LFTs . Cardiogenic shock ([place])    LOS: 2 days   Additional comments:I reviewed the patient's new clinical lab test results.   and I reviewed the patients new imaging test results.    Cardiogenic Shock - currently being managed by HF team, HR improved to 120s at the time of my exam, remains on amio gtt,milrinone gtt,  digoxin, and high dose lasix.  Ebstein'sanomaly - repair at age 89 AKI - Cr 1.98 Acute liver failure - AST/ALT slightly down, Tbili 2.2 from 2.7. Hepatitis panel is negative. Tylenol level < 10. Likely 2/2 hypoperfusion/shock liver Hyperkalemia - resolved, monitor  RUQ abdominal pain, n/v Possible Acalculous Cholecystitis - Most likely 2/2 to hepatic congestion/ascites and elevated LFTs likely are related to current cardiogenic shock.  HIDA pending when hemodynamically stable from primary team standpoint. In the interim, may d/c abx in the setting of normal WBC and AF, but defer final decision to primary  team. If HIDA is positive, would recommend perc chole tube by IR.    Jesusita Oka, MD Trauma & General Surgery Please use [email or web address] to contact on call provider  02/08/2020  *Care during the described time interval was provided by me. I have reviewed this patient's available data, including medical history, events of note, physical examination and test results as part of my evaluation.

## 2020-02-08 NOTE — Progress Notes (Signed)
Lower extremity venous bilateral study completed.   Please see CV Proc for preliminary results.   Perry Cook  

## 2020-02-08 NOTE — Progress Notes (Signed)
pro   NAME:  Perry Cook, MRN:  [id number], DOB:  [birth year], LOS: 2 ADMISSION DATE:  02/06/2020, CONSULTATION DATE: 02/07/20 REFERRING MD:  Dr. Rito Ehrlich, CHIEF COMPLAINT:  Abd pain, nausea  Brief History   34 y/o M with history of Ebstein's anomaly s/p repair 2002 presenting at [facility] complaining of RUQ abdominal pain for the past month, found to be in recurrent AFL who developed cardiogenic shock.  Transferred to [facility] 9/15, HF team and cardiology following.   History of present illness   34 y/o M with PMH of history of Ebstein's anomaly who presented to [facility] ER on 9/14 with RUQ abdominal pain and intermittent palpitations.  He has been experiencing RUQ abdominal pain for about a month.  He has noticed a decrease in his appetite due to nausea and admits to intermittent diarrhea.  He also has noted palpitations when resting.  He works at [employer] and has been experiencing shortness of breath with exertion. Pt reports history of vaping and 1-2 alcoholic drinks per day "but more when out at a show".  He states that he has not vaped or had any alcohol for the past month because he felt so poorly.   In the [facility] ED he was found to be in SVT with a HR of 177 bpm, which converted to NSR briefly after adenosine and metoprolol. Heart rate has been elevated in 140's-150's since 1900 9/14 with transient hypotension with SBP in 80's.  Abdominal CT showed hepatomegaly, indicative of hepatic steatosis, with significant passive hepatic congestion.   Labs significant for K+: 6.0, Glucose: 25~53 (required dextrose IV)~135, AG: 19, BUN: 22, Cr 0.9 which rose to 1.82, AST: > 10,000, ALT: 4,002, Bili: 1.4.    He was initially diagnosed with Ebstein's after collapsing at football practice. 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  Pt denies recent injuries, falls, fevers/chills, weight gain, LE swelling.   Past Medical History    CHD- Ebstein's anomaly syndrome s/p repair in 2002 (has not followed with cardiology for many years)  Significant Hospital Events   9/14 Admit  9/15 PCCM consulted; tx to [facility] 2H  Consults:  Cardiology HF CCS   Procedures:  9/15 R IJ CVL >>   Significant Diagnostic Tests:  9/14 CT ABD / Pelvis >> cardiomegaly with severe right atrial dilatation and reflux of contrast media into the liver with hepatomegaly, hepatic steatosis, small volume ascites 9/14 [place] ABD >> no gallstones, gallbladder wall thickening with edema and pericholecystic fluid, mild ascites, bidirectional flow in the portal vein 9/14 ECHO >>  LVEF 35%, RV massively dilated and severely HK; Ebstein anomaly s/p repair with severe TR and to-fro in hepatic veins 9/15 LE Doppler >>  9/15 HIDA Scan >>   Micro Data:  COVID 9/14 >> negative  UC 9/14 >>  UA 9/14 >> negative  Antimicrobials:  Ceftriaxone 9/15 >>   Interim history/subjective:  Aflutter with RVR placed on amio gtt.  Milrinone gtt started for CO-OX 45%, CVP 34 overnight started on high dose lasix   CO-OX 58%- milrinone at 0.25 mcg CVP 25 -1L / net +53  tmax 99.9 On room air   Patient complains of severe fatigue.  Denies SOB, Nausea, palpitations, or chest pain  Objective   Blood pressure 117/72, pulse 80, temperature 99.3 F (37.4 C), temperature source Oral, resp. rate 16, height 6' (1.829 m), weight 70.8 kg, SpO2 96 %. CVP:  [  18 mmHg-51 mmHg] 26 mmHg      Intake/Output Summary (Last 24 hours) at 02/08/2020 1146 Last data filed at 02/08/2020 1000 Gross per 24 hour  Intake 1240.7 ml  Output 1775 ml  Net -534.3 ml   Filed Weights   02/06/20 0949 02/08/20 0607  Weight: 69.5 kg 70.8 kg    Examination: General:  Adult male sitting in bedside recliner in NAD HEENT: MM pink/moist, +JVP Neuro: AOx 4, MAE CV: IRIR, +murmur PULM:  Non labored, clear, diminished in bases GI: soft, NT/ ND, +bs Extremities: warm/dry, no LE edema  Skin: no rashes    PCXR 9/16 >> images personally reviewed, cardiomegaly, no acute process  Resolved Hospital Problem list      Assessment & Plan:   Cardiogenic Shock in setting of Ebstein's Anomaly s/p Repair  Acute on Chronic Right-sided Heart Failure In the setting of Ebstein's anomaly syndrome s/p repair (2002).  Pt prescribed digoxin and toprol XL with non compliance since 2004. CT showed cardiomegaly and severely dilated right atrium, severe TR and bi-directional flow in hepatic veins. - Appreciate HF and Cardiology assistance  - tele/ ICU monitoring - milrinone 0.25 mcg, CO-OX BID - trend CVP, lasix 120 mg BID - goal  MAP > 65  Atrial Flutter with RVR  - amio per cardiology  - will need TEE / DCCV eventually  -- heparin gtt per pharmacy  - TSH 8.762, check free T4  Shock Liver secondary to acute right heart failure  Possible acalculous cholecystitis vs passive hepatic congestion Abdominal CT showed hepatomegaly (19.8 cm) with findings indicative of hepatic steatosis and significant passive hepatic congestion.  Tylenol level negative. - CCS following, doubtful for acalculous choleycstitis - pending HIDA scan - acute hepatitis panel negative  - lactate clearing - d/c abx, monitor clinically for now  - trend LFTs -Hida scan ordered   Acute Kidney Injury in the setting of Metabolic Acidosis with Hyperkalemia Lactic Acidosis  - improving sCr today, UOP ~ 1 mg/kg/hr -Trend BMP / urinary output -Replace electrolytes as indicated -Avoid nephrotoxic agents, ensure adequate renal perfusion  Normocytic Anemia  Thrombocytopenia  - trend CBC  Hypoglycemia Suspect in setting of hepatic dysfunction with heart failure - trend CBG Q4 hours  Hypocalcemia  - corrected calcium 9 today  Best practice:  Diet: Heart healthy as tolerated  Pain/Anxiety/Delirium protocol (if indicated): n/a  VAP protocol (if indicated): n/a  DVT prophylaxis: heparin  GI prophylaxis: n/a  Glucose control: CBG  Q4 Mobility: As tolerated  Code Status: Full Code  Family Communication: Patient updated on plan of care  Disposition: ICU, 2H for advanced heart failure   Labs   CBC: Recent Labs  Lab 02/06/20 1025 02/07/20 1333 02/08/20 0435  WBC 5.9 14.1* 10.4  NEUTROABS 4.3  --   --   HGB 12.7* 10.9* 11.6*  HCT 38.7* 36.1* 36.1*  MCV 90.0 97.3 90.9  PLT 218 145* 152    Basic Metabolic Panel: Recent Labs  Lab 02/06/20 1025 02/07/20 0531 02/07/20 1333 02/07/20 2013 02/08/20 0700  NA 133* 134* 135 130* 133*  K 4.2 6.0* 5.4* 5.1 4.1  CL 96* 100 109 97* 96*  CO2 24 15* 14* 20* 24  GLUCOSE 113* 25* 91 140* 110*  BUN 12 22* 22* 29* 31*  CREATININE 0.93 1.82* 1.65* 2.28* 1.98*  CALCIUM 9.0 8.1* 5.9* 8.0* 8.1*   GFR: Estimated Creatinine Clearance: 52.6 mL/min (A) (by C-G formula based on SCr of 1.98 mg/dL (H)). Recent Labs  Lab  02/06/20 1025 02/07/20 1332 02/07/20 1333 02/07/20 2013 02/07/20 2231 02/08/20 0435 02/08/20 0700  WBC 5.9  --  14.1*  --   --  10.4  --   LATICACIDVEN  --  7.2*  --  5.3* 4.7*  --  2.2*    Liver Function Tests: Recent Labs  Lab 02/06/20 1025 02/07/20 0531 02/07/20 1333 02/08/20 0700  AST 801* >10,000* 8,536* 8,423*  ALT 566* 4,002* 3,023* 3,691*  ALKPHOS 54 60 43 60  BILITOT 1.7* 2.7* 2.1* 2.2*  PROT 7.3 7.4 5.2* 6.1*  ALBUMIN 3.7 3.6 2.4* 2.9*   Recent Labs  Lab 02/06/20 1025  LIPASE 29   No results for input(s): AMMONIA in the last 168 hours.  ABG    Component Value Date/Time   O2SAT 58.0 02/08/2020 0445     Coagulation Profile: No results for input(s): INR, PROTIME in the last 168 hours.  Cardiac Enzymes: No results for input(s): CKTOTAL, CKMB, CKMBINDEX, TROPONINI in the last 168 hours.  HbA1C: No results found for: HGBA1C  CBG: Recent Labs  Lab 02/07/20 2120 02/07/20 2354 02/08/20 0401 02/08/20 0814 02/08/20 1121  GLUCAP 107* 102* 105* 105* 175*      Critical care time: 30 minutes     Posey Boyer, MSN,  AGACNP-BC [facility] 02/08/2020, 11:46 AM  See Loretha Stapler for personal pager PCCM on call pager [phone]

## 2020-02-08 NOTE — Progress Notes (Signed)
ANTICOAGULATION CONSULT NOTE  Pharmacy Consult for Heparin Indication: Atrial flutter with RVR  No Known Allergies  Patient Measurements: Height: 6' (182.9 cm) Weight: 70.8 kg (156 lb 1.4 oz) IBW/kg (Calculated) : 77.6 Heparin dosing weight: 70 kg  Vital Signs: Temp: 99 F (37.2 C) (09/16 1204) Temp Source: Oral (09/16 1204) BP: 114/64 (09/16 1317) Pulse Rate: 82 (09/16 1317)  Labs: Recent Labs    02/06/20 1025 02/07/20 0531 02/07/20 1333 02/07/20 1450 02/07/20 2013 02/08/20 0435 02/08/20 0700 02/08/20 1230  HGB 12.7*  --  10.9*  --   --  11.6*  --   --   HCT 38.7*  --  36.1*  --   --  36.1*  --   --   PLT 218  --  145*  --   --  152  --   --   HEPARINUNFRC  --   --   --   --   --  0.11*  --  0.16*  CREATININE 0.93   < > 1.65*  --  2.28*  --  1.98*  --   TROPONINIHS  --   --   --  22*  --   --   --   --    < > = values in this interval not displayed.    Estimated Creatinine Clearance: 52.6 mL/min (A) (by C-G formula based on SCr of 1.98 mg/dL (H)).   Medical History: Past Medical History:  Diagnosis Date   Congenital heart disease    Epstein anomaly s/p repair 2002    Assessment: 34 year old malewith a hx of Ebstein's anomaly syndrome s/p repair 2011. Patient with atrial flutter with RVR in the setting of cardiogenic shock. Pharmacy consulted to initiate heparin therapy. Patient was not on any anticoagulation PTA.  Heparin level remains subtherapeutic despite re-bolusing 2000 units and increasing drip rate to 1250 units/hr this morning. CBC stable with Hgb 11.6, plts 152. No overt bleeding per discussion with patient. No infusion issues noted.  Goal of Therapy:  Heparin level 0.3-0.7 units/ml Monitor platelets by anticoagulation protocol: Yes   Plan:  Give 2000 unit bolus x1 Increase heparin infusion to 1500 units/hr Check 6-hr HL Monitor daily HL, CBC, s/sx bleeding   Tama Headings, PharmD PGY2 Cardiology Pharmacy Resident Phone:  [phone] 02/08/2020  2:45 PM  Please check [email or web address] for unit-specific pharmacy phone numbers.

## 2020-02-08 NOTE — Progress Notes (Addendum)
Advanced Heart Failure Rounding Note  PCP-Cardiologist: Thurmon Fair, MD   Subjective:   Transferred to [facility] for shock management.   9/15 A flutter RVR. Started on amio drip. CO-OX 45% milrinone started. CVP high started on IV lasix. Negative 1 liter.   Lactic acid 7.2>5.3>4.7 >pending    On milrinone 0.25 mcg. CO-OX 58%.    CVP:  [18 mmHg-51 mmHg] 25 mmHg  Denies SOB.    Objective:   Weight Range: 70.8 kg Body mass index is 21.17 kg/m.   Vital Signs:   Temp:  [98.8 F (37.1 C)-99.9 F (37.7 C)] 99.9 F (37.7 C) (09/16 0400) Pulse Rate:  [36-166] 55 (09/16 0630) Resp:  [11-34] 15 (09/16 0630) BP: (85-138)/(52-116) 102/56 (09/16 0630) SpO2:  [89 %-100 %] 94 % (09/16 0630) Weight:  [70.8 kg] 70.8 kg (09/16 0607)    Weight change: Filed Weights   02/06/20 0949 02/08/20 0607  Weight: 69.5 kg 70.8 kg    Intake/Output:   Intake/Output Summary (Last 24 hours) at 02/08/2020 0719 Last data filed at 02/08/2020 9678 Gross per 24 hour  Intake 728.1 ml  Output 1775 ml  Net -1046.9 ml      Physical Exam   CVP 25-26  General:  No resp difficulty HEENT: Normal Neck: Supple. JVP to jaw . Carotids 2+ bilat; no bruits. No lymphadenopathy or thyromegaly appreciated. RIJ Cor: PMI nondisplaced. Irregular rate & rhythm. + TR Lungs: Clear Abdomen: Soft, + RUQ tender, nondistended. No hepatosplenomegaly. No bruits or masses. Good bowel sounds. Extremities: warm, no cyanosis, clubbing, rash, edema Neuro: Alert & orientedx3, cranial nerves grossly intact. moves all 4 extremities w/o difficulty. Affect pleasant   Telemetry    A Fib/Flutter RVR 130-140s Personally reviewed  Labs    CBC Recent Labs    02/06/20 1025 02/07/20 1333  WBC 5.9 14.1*  NEUTROABS 4.3  --   HGB 12.7* 10.9*  HCT 38.7* 36.1*  MCV 90.0 97.3  PLT 218 145*   Basic Metabolic Panel Recent Labs    93/81/01 1333 02/07/20 2013  NA 135 130*  K 5.4* 5.1  CL 109 97*  CO2 14* 20*  GLUCOSE 91  140*  BUN 22* 29*  CREATININE 1.65* 2.28*  CALCIUM 5.9* 8.0*   Liver Function Tests Recent Labs    02/07/20 0531 02/07/20 1333  AST >10,000* 8,536*  ALT 4,002* 3,023*  ALKPHOS 60 43  BILITOT 2.7* 2.1*  PROT 7.4 5.2*  ALBUMIN 3.6 2.4*   Recent Labs    02/06/20 1025  LIPASE 29   Cardiac Enzymes No results for input(s): CKTOTAL, CKMB, CKMBINDEX, TROPONINI in the last 72 hours.  BNP: BNP (last 3 results) Recent Labs    02/07/20 1450  BNP 862.2*    ProBNP (last 3 results) No results for input(s): PROBNP in the last 8760 hours.   D-Dimer No results for input(s): DDIMER in the last 72 hours. Hemoglobin A1C No results for input(s): HGBA1C in the last 72 hours. Fasting Lipid Panel No results for input(s): CHOL, HDL, LDLCALC, TRIG, CHOLHDL, LDLDIRECT in the last 72 hours. Thyroid Function Tests Recent Labs    02/07/20 1500  TSH 8.762*    Other results:   Imaging    DG CHEST PORT 1 VIEW  Result Date: 02/07/2020 CLINICAL DATA:  Status post central line placement. EXAM: PORTABLE CHEST 1 VIEW COMPARISON:  02/06/2020 FINDINGS: Previous median sternotomy. Right IJ catheter tip projects over the cavoatrial junction. Marked cardiac enlargement noted. No pleural effusion, interstitial edema,  pneumothorax or airspace consolidation. IMPRESSION: Satisfactory position of right IJ catheter with tip projecting over the cavoatrial junction. No pneumothorax identified. Electronically Signed   By: Signa Kell M.D.   On: 02/07/2020 19:01      Medications:     Scheduled Medications: . Chlorhexidine Gluconate Cloth  6 each Topical Daily  . digoxin  0.125 mg Intravenous Daily  . digoxin  0.125 mg Oral Daily  . nicotine  14 mg Transdermal Daily     Infusions: . amiodarone 60 mg/hr (02/08/20 0500)  . dextrose    . heparin 1,250 Units/hr (02/08/20 0109)  . milrinone 0.25 mcg/kg/min (02/08/20 0500)     PRN Medications:  clonazepam, morphine injection, ondansetron  **OR** ondansetron (ZOFRAN) IV    Patient Profile   [name] a 34 year old malewith a hx of Ebstein's anomaly syndrome s/p repair 2011 whom we are asked to see by Dr. Addison Lank for cardiogenic shock  He has a hx ofEbstein's anomaly syndromewith repair at age 38yo.He reports he was initially diagnosed after collapsing during football practice.He states he was previously treated for unknown arrhythmia in the postsurgical setting with digoxin and beta-blocker. He was only on medication for short period of time.He has not followed with cardiology care in many years.   Assessment/Plan   1. 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 Ebstein anomaly s/p repair with severe TR and to-fro flow in hepatic veins - baseline RV function and degree of TR unclear suspect decompensation related to AFL  - now with shock liver and kidney - Started on milrinone 9/15 low CO-OX. CO-OX up to 58%. Continue milrinone 0.25 mcg.  - CVP 25-26. Start IV lasix 120 mg IV twice a day.  - No bb/arni/spiro for now with shock.  - Hold dig.   2. Atrial flutter with RVR - start IV amio and heparin - eventual TEE/DC-CV (if not more emergent) - Dr. Addison Lank has discussed with EP and seems to be 1:1 RA circuit - will eventually need TEE/DC-CV (vs ablation)  if does not convert with amio - Continue amio drip 60 mg per hour + heparin drip.    3. AKI - due to shock - labs pending.   4. Shock liver - due to cardiogenic shock hepatitis panels negative  5. Hyperkalemia - lokelma - treat shock   Length of Stay: 2  Amy Clegg, NP  02/08/2020, 7:19 AM  Advanced Heart Failure Team Pager [phone] (M-F; 7a - 4p)  Please contact [facility] for night-coverage after hours (4p -7a ) and weekends on [email or web address]  Agree with above.   Now on milrinone 0.25, IV amio and  heparin.   Still in AFL but rate somewhat improved. Co-ox up to 58%. Creatinine and LFTs now starting to come down. Making urine with IV lasix. Lactic acid 2.2  CVP 34 -> 25  General:  Sitting up in bed  No resp difficulty HEENT: normal Neck: supple. JVP to jaw. Carotids 2+ bilat; no bruits. No lymphadenopathy or thryomegaly appreciated. Cor: PMI nondisplaced. Regular tachy + RV lift. No rubs, gallops or murmurs. Lungs: clear Abdomen: soft, nontender, + distended. No hepatosplenomegaly. No bruits or masses. Good bowel sounds. Extremities: no cyanosis, clubbing, rash, 2+ edema   Neuro: alert & orientedx3, cranial nerves grossly intact. moves all 4 extremities w/o difficulty. Affect pleasant  Shock starting to resolve with hemodynamic support. Remains volume overload with severe  RV failure. AFL persists. Will continue IV amio and heparin. Will need TEE/DC-CV as he stabilizes. Possibly tomorrow or more Monday. Will need EP to see for possible ablation. Will need TEE to evaluate TV repair.   Continue to diurese. Supp K as needed.   CRITICAL CARE Performed by: Arvilla Meres  Total critical care time: 45 minutes  Critical care time was exclusive of separately billable procedures and treating other patients.  Critical care was necessary to treat or prevent imminent or life-threatening deterioration.  Critical care was time spent personally by me (independent of midlevel providers or residents) on the following activities: development of treatment plan with patient and/or surrogate as well as nursing, discussions with consultants, evaluation of patient's response to treatment, examination of patient, obtaining history from patient or surrogate, ordering and performing treatments and interventions, ordering and review of laboratory studies, ordering and review of radiographic studies, pulse oximetry and re-evaluation of patient's condition.  Arvilla Meres, MD  8:25 AM

## 2020-02-09 LAB — BASIC METABOLIC PANEL
Anion gap: 9 (ref 5–15)
Anion gap: 11 (ref 5–15)
BUN: 20 mg/dL (ref 6–20)
BUN: 17 mg/dL (ref 6–20)
CO2: 30 mmol/L (ref 22–32)
CO2: 32 mmol/L (ref 22–32)
Calcium: 8 mg/dL — ABNORMAL LOW (ref 8.9–10.3)
Calcium: 8.2 mg/dL — ABNORMAL LOW (ref 8.9–10.3)
Chloride: 92 mmol/L — ABNORMAL LOW (ref 98–111)
Chloride: 89 mmol/L — ABNORMAL LOW (ref 98–111)
Creatinine, Ser: 1.19 mg/dL (ref 0.61–1.24)
Creatinine, Ser: 1.1 mg/dL (ref 0.61–1.24)
GFR calc Af Amer: >60 mL/min (ref >60)
GFR calc Af Amer: >60 mL/min (ref >60)
GFR calc non Af Amer: >60 mL/min (ref >60)
GFR calc non Af Amer: >60 mL/min (ref >60)
Glucose, Bld: 104 mg/dL — ABNORMAL HIGH (ref 70–99)
Glucose, Bld: 120 mg/dL — ABNORMAL HIGH (ref 70–99)
Potassium: 3.1 mmol/L — ABNORMAL LOW (ref 3.5–5.1)
Potassium: 3.9 mmol/L (ref 3.5–5.1)
Sodium: 131 mmol/L — ABNORMAL LOW (ref 135–145)
Sodium: 132 mmol/L — ABNORMAL LOW (ref 135–145)

## 2020-02-09 LAB — COOXEMETRY PANEL
Carboxyhemoglobin: 1.7 % — ABNORMAL HIGH (ref 0.5–1.5)
Carboxyhemoglobin: 1.1 % (ref 0.5–1.5)
Carboxyhemoglobin: 1.5 % (ref 0.5–1.5)
Carboxyhemoglobin: 1.8 % — ABNORMAL HIGH (ref 0.5–1.5)
Methemoglobin: 1 % (ref 0.0–1.5)
Methemoglobin: 0.7 % (ref 0.0–1.5)
Methemoglobin: 1 % (ref 0.0–1.5)
Methemoglobin: 0.9 % (ref 0.0–1.5)
O2 Saturation: 65.4 %
O2 Saturation: 47.5 %
O2 Saturation: 44.9 %
O2 Saturation: 59.7 %
Total hemoglobin: 11.9 g/dL — ABNORMAL LOW (ref 12.0–16.0)
Total hemoglobin: 12.8 g/dL (ref 12.0–16.0)
Total hemoglobin: 13.1 g/dL (ref 12.0–16.0)
Total hemoglobin: 13.3 g/dL (ref 12.0–16.0)

## 2020-02-09 LAB — CBC
HCT: 34.4 % — ABNORMAL LOW (ref 39.0–52.0)
Hemoglobin: 11.3 g/dL — ABNORMAL LOW (ref 13.0–17.0)
MCH: 28.8 pg (ref 26.0–34.0)
MCHC: 32.8 g/dL (ref 30.0–36.0)
MCV: 87.8 fL (ref 80.0–100.0)
Platelets: 153 10*3/uL (ref 150–400)
RBC: 3.92 MIL/uL — ABNORMAL LOW (ref 4.22–5.81)
RDW: 15 % (ref 11.5–15.5)
WBC: 9.7 10*3/uL (ref 4.0–10.5)
nRBC: 1.4 % — ABNORMAL HIGH (ref 0.0–0.2)

## 2020-02-09 LAB — HEPARIN LEVEL (UNFRACTIONATED)
Heparin Unfractionated: 0.32 IU/mL (ref 0.30–0.70)
Heparin Unfractionated: 0.37 IU/mL (ref 0.30–0.70)

## 2020-02-09 LAB — MAGNESIUM: Magnesium: 1.7 mg/dL (ref 1.7–2.4)

## 2020-02-09 LAB — T4, FREE: Free T4: 1.15 ng/dL — ABNORMAL HIGH (ref 0.61–1.12)

## 2020-02-09 LAB — PROCALCITONIN: Procalcitonin: 9 ng/mL

## 2020-02-09 LAB — GLUCOSE, CAPILLARY
Glucose-Capillary: 118 mg/dL — ABNORMAL HIGH (ref 70–99)
Glucose-Capillary: 125 mg/dL — ABNORMAL HIGH (ref 70–99)
Glucose-Capillary: 131 mg/dL — ABNORMAL HIGH (ref 70–99)
Glucose-Capillary: 90 mg/dL (ref 70–99)
Glucose-Capillary: 93 mg/dL (ref 70–99)
Glucose-Capillary: 96 mg/dL (ref 70–99)

## 2020-02-09 LAB — HEPATIC FUNCTION PANEL
ALT: 2722 U/L — ABNORMAL HIGH (ref 0–44)
AST: 3953 U/L — ABNORMAL HIGH (ref 15–41)
Albumin: 2.8 g/dL — ABNORMAL LOW (ref 3.5–5.0)
Alkaline Phosphatase: 69 U/L (ref 38–126)
Bilirubin, Direct: 1.2 mg/dL — ABNORMAL HIGH (ref 0.0–0.2)
Indirect Bilirubin: 1.5 mg/dL — ABNORMAL HIGH (ref 0.3–0.9)
Total Bilirubin: 2.7 mg/dL — ABNORMAL HIGH (ref 0.3–1.2)
Total Protein: 6.1 g/dL — ABNORMAL LOW (ref 6.5–8.1)

## 2020-02-09 MED ORDER — POLYETHYLENE GLYCOL 3350 17 G PO PACK
17.0000 g | PACK | Freq: Once | ORAL | Status: AC
  Administered 2020-02-09: 17 g via ORAL
  Filled 2020-02-09: qty 1

## 2020-02-09 MED ORDER — POTASSIUM CHLORIDE 10 MEQ/50ML IV SOLN
10.0000 meq | INTRAVENOUS | Status: AC
  Administered 2020-02-09 (×3): 10 meq via INTRAVENOUS
  Filled 2020-02-09 (×3): qty 50

## 2020-02-09 MED ORDER — ALUM & MAG HYDROXIDE-SIMETH 200-200-20 MG/5 ML NICU TOPICAL
1.0000 "application " | TOPICAL | Status: DC | PRN
  Filled 2020-02-09: qty 355

## 2020-02-09 MED ORDER — GUAIFENESIN-DM 100-10 MG/5ML PO SYRP
5.0000 mL | ORAL_SOLUTION | ORAL | Status: DC | PRN
  Administered 2020-02-09: 5 mL via ORAL
  Filled 2020-02-09: qty 5

## 2020-02-09 MED ORDER — SODIUM CHLORIDE 0.9 % IV SOLN
2.0000 g | INTRAVENOUS | Status: AC
  Administered 2020-02-09 – 2020-02-13 (×5): 2 g via INTRAVENOUS
  Filled 2020-02-09 (×4): qty 2
  Filled 2020-02-09: qty 20

## 2020-02-09 MED ORDER — SPIRONOLACTONE 12.5 MG HALF TABLET
12.5000 mg | ORAL_TABLET | Freq: Every day | ORAL | Status: DC
  Administered 2020-02-09: 12.5 mg via ORAL
  Filled 2020-02-09 (×2): qty 1

## 2020-02-09 MED ORDER — MAGNESIUM SULFATE 4 GM/100ML IV SOLN
4.0000 g | Freq: Once | INTRAVENOUS | Status: AC
  Administered 2020-02-09: 4 g via INTRAVENOUS
  Filled 2020-02-09: qty 100

## 2020-02-09 MED ORDER — POTASSIUM CHLORIDE CRYS ER 20 MEQ PO TBCR
40.0000 meq | EXTENDED_RELEASE_TABLET | Freq: Once | ORAL | Status: AC
  Administered 2020-02-09: 40 meq via ORAL
  Filled 2020-02-09: qty 2

## 2020-02-09 MED ORDER — POTASSIUM CHLORIDE CRYS ER 20 MEQ PO TBCR
40.0000 meq | EXTENDED_RELEASE_TABLET | ORAL | Status: AC
  Administered 2020-02-09 (×2): 40 meq via ORAL
  Filled 2020-02-09 (×2): qty 2

## 2020-02-09 MED ORDER — MILRINONE LACTATE IN DEXTROSE 20-5 MG/100ML-% IV SOLN
0.2500 ug/kg/min | INTRAVENOUS | Status: DC
  Administered 2020-02-09 – 2020-02-11 (×4): 0.375 ug/kg/min via INTRAVENOUS
  Administered 2020-02-11 – 2020-02-13 (×3): 0.25 ug/kg/min via INTRAVENOUS
  Filled 2020-02-09 (×6): qty 100

## 2020-02-09 MED ORDER — ALUM & MAG HYDROXIDE-SIMETH 200-200-20 MG/5ML PO SUSP
30.0000 mL | ORAL | Status: DC | PRN
  Administered 2020-02-13: 30 mL via ORAL
  Filled 2020-02-09: qty 30

## 2020-02-09 NOTE — Progress Notes (Signed)
pro   NAME:  Perry Cook, MRN:  [id number], DOB:  [birth year], LOS: 3 ADMISSION DATE:  02/06/2020, CONSULTATION DATE: 02/07/20 REFERRING MD:  Dr. Rito Ehrlich, CHIEF COMPLAINT:  Abd pain, nausea  Brief History   34 y/o M with history of Ebstein's anomaly s/p repair 2002 presenting at [facility] complaining of RUQ abdominal pain for the past month, found to be in recurrent AFL who developed cardiogenic shock.  Transferred to [facility] 9/15, HF team and cardiology following.   History of present illness   34 y/o M with PMH of history of Ebstein's anomaly who presented to [facility] ER on 9/14 with RUQ abdominal pain and intermittent palpitations.  He has been experiencing RUQ abdominal pain for about a month.  He has noticed a decrease in his appetite due to nausea and admits to intermittent diarrhea.  He also has noted palpitations when resting.  He works at [employer] and has been experiencing shortness of breath with exertion. Pt reports history of vaping and 1-2 alcoholic drinks per day "but more when out at a show".  He states that he has not vaped or had any alcohol for the past month because he felt so poorly.   In the [facility] ED he was found to be in SVT with a HR of 177 bpm, which converted to NSR briefly after adenosine and metoprolol. Heart rate has been elevated in 140's-150's since 1900 9/14 with transient hypotension with SBP in 80's.  Abdominal CT showed hepatomegaly, indicative of hepatic steatosis, with significant passive hepatic congestion.   Labs significant for K+: 6.0, Glucose: 25~53 (required dextrose IV)~135, AG: 19, BUN: 22, Cr 0.9 which rose to 1.82, AST: > 10,000, ALT: 4,002, Bili: 1.4.    He was initially diagnosed with Ebstein's after collapsing at football practice. 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  Pt denies recent injuries, falls, fevers/chills, weight gain, LE swelling.   Past Medical History    [facility]- Ebstein's anomaly syndrome s/p repair in 2002 (has not followed with cardiology for many years)  Significant Hospital Events   9/14 Admit  9/15 PCCM consulted; tx to [facility] 2H  Consults:  Cardiology HF CCS   Procedures:  9/15 R IJ CVL >>   Significant Diagnostic Tests:  9/14 CT ABD / Pelvis >> cardiomegaly with severe right atrial dilatation and reflux of contrast media into the liver with hepatomegaly, hepatic steatosis, small volume ascites 9/14 [place] ABD >> no gallstones, gallbladder wall thickening with edema and pericholecystic fluid, mild ascites, bidirectional flow in the portal vein 9/14 ECHO >>  LVEF 35%, RV massively dilated and severely HK; Ebstein anomaly s/p repair with severe TR and to-fro in hepatic veins 9/15 LE Doppler >>  9/15 HIDA Scan >>   Micro Data:  COVID 9/14 >> negative  UC 9/14 >>  UA 9/14 >> negative  Antimicrobials:  Ceftriaxone 9/15 >>   Interim history/subjective:   No issues overnight. Discussed with cardiology this morning. Restarted abx due to increasing pct   Objective   Blood pressure 104/66, pulse (!) 105, temperature 97.9 F (36.6 C), temperature source Oral, resp. rate 17, height 6' (1.829 m), weight 68.4 kg, SpO2 99 %. CVP:  [20 mmHg-27 mmHg] 21 mmHg      Intake/Output Summary (Last 24 hours) at 02/09/2020 1129 Last data filed at 02/09/2020 1015 Gross per 24 hour  Intake 2034.53 ml  Output 6125 ml  Net -4090.47  ml   Filed Weights   02/06/20 0949 02/08/20 0607 02/09/20 0756  Weight: 69.5 kg 70.8 kg 68.4 kg    Examination: General:  Male, resting in bed, NAD  HEENT: +JVP Neuro: alert oriented, following commands  CV: irregularly irregular, s1 s2  PULM:  Soft, nt, nd  GI: Soft, nt nd  Extremities:  Skin: no rashes   CXR: enlarged cardiac boarders, minimal congestion  The patient's images have been independently reviewed by me.    Resolved Hospital Problem list      Assessment & Plan:   Cardiogenic Shock in  setting of Ebstein's Anomaly s/p Repair  Acute on Chronic Right-sided Heart Failure In the setting of Ebstein's anomaly syndrome s/p repair (2002).  Pt prescribed digoxin and toprol XL with non compliance since 2004. CT showed cardiomegaly and severely dilated right atrium, severe TR and bi-directional flow in hepatic veins. - continue HF team support and assistance  - tele  - ICU level care  - milrinone  - follow COOX and CVPs  - Continue lasix  - Follow UOP - MAP goal >65 mmHg   Atrial Flutter with RVR  - amio + heparin  - plans for dccv at some point per cards  - ok with amio per cardiology, does have current liver injury which is likely congestion   Shock Liver secondary to acute right heart failure  Possible acalculous cholecystitis vs passive hepatic congestion Abdominal CT showed hepatomegaly (19.8 cm) with findings indicative of hepatic steatosis and significant passive hepatic congestion.  Tylenol level negative. - CCS following, doubtful for acalculous choleycstitis - HIDA, plans to hold at this point   Positive Pro-cal  - unclear etiology of infection  - continue empiric ceftriaxone  - discussed with cardiology   Acute Kidney Injury in the setting of Metabolic Acidosis with Hyperkalemia Lactic Acidosis  - follow lytes - follow Scr and UOP  Normocytic Anemia  Thrombocytopenia  - follow CBC - conservative transfusion threshold hgb <7   Hypoglycemia Likely related to hepatic injury   Hypocalcemia  Observe   Best practice:  Diet: Heart healthy as tolerated  Pain/Anxiety/Delirium protocol (if indicated): n/a  VAP protocol (if indicated): n/a  DVT prophylaxis: heparin  GI prophylaxis: n/a  Glucose control: CBG Q4 Mobility: As tolerated  Code Status: Full Code  Family Communication: family at bedside/patient  Disposition: ICU, 2H for advanced heart failure   Labs   CBC: Recent Labs  Lab 02/06/20 1025 02/07/20 1333 02/08/20 0435 02/09/20 0522  WBC  5.9 14.1* 10.4 9.7  NEUTROABS 4.3  --   --   --   HGB 12.7* 10.9* 11.6* 11.3*  HCT 38.7* 36.1* 36.1* 34.4*  MCV 90.0 97.3 90.9 87.8  PLT 218 145* 152 153    Basic Metabolic Panel: Recent Labs  Lab 02/07/20 0531 02/07/20 1333 02/07/20 2013 02/08/20 0700 02/09/20 0522  NA 134* 135 130* 133* 131*  K 6.0* 5.4* 5.1 4.1 3.1*  CL 100 109 97* 96* 92*  CO2 15* 14* 20* 24 30  GLUCOSE 25* 91 140* 110* 104*  BUN 22* 22* 29* 31* 20  CREATININE 1.82* 1.65* 2.28* 1.98* 1.19  CALCIUM 8.1* 5.9* 8.0* 8.1* 8.0*  MG  --   --   --   --  1.7   GFR: Estimated Creatinine Clearance: 84.6 mL/min (by C-G formula based on SCr of 1.19 mg/dL). Recent Labs  Lab 02/06/20 1025 02/07/20 1332 02/07/20 1333 02/07/20 2013 02/07/20 2231 02/08/20 0435 02/08/20 0700 02/09/20 0522  PROCALCITON  --   --   --   --   --   --   --  9.00  WBC 5.9  --  14.1*  --   --  10.4  --  9.7  LATICACIDVEN  --  7.2*  --  5.3* 4.7*  --  2.2*  --     Liver Function Tests: Recent Labs  Lab 02/06/20 1025 02/07/20 0531 02/07/20 1333 02/08/20 0700  AST 801* >10,000* 8,536* 8,423*  ALT 566* 4,002* 3,023* 3,691*  ALKPHOS 54 60 43 60  BILITOT 1.7* 2.7* 2.1* 2.2*  PROT 7.3 7.4 5.2* 6.1*  ALBUMIN 3.7 3.6 2.4* 2.9*   Recent Labs  Lab 02/06/20 1025  LIPASE 29   No results for input(s): AMMONIA in the last 168 hours.  ABG    Component Value Date/Time   O2SAT 65.4 02/09/2020 0522     Coagulation Profile: No results for input(s): INR, PROTIME in the last 168 hours.  Cardiac Enzymes: No results for input(s): CKTOTAL, CKMB, CKMBINDEX, TROPONINI in the last 168 hours.  HbA1C: No results found for: HGBA1C  CBG: Recent Labs  Lab 02/08/20 1520 02/08/20 2006 02/08/20 2340 02/09/20 0354 02/09/20 0826  GLUCAP 117* 121* 117* 93 125*     This patient is critically ill with multiple organ system failure; which, requires frequent high complexity decision making, assessment, support, evaluation, and titration of  therapies. This was completed through the application of advanced monitoring technologies and extensive interpretation of multiple databases. During this encounter critical care time was devoted to patient care services described in this note for 33 minutes.  Josephine Igo, DO [facility] 02/09/2020 11:29 AM

## 2020-02-09 NOTE — Progress Notes (Addendum)
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  9/15 in A flutter RVR. Started on amio drip. CO-OX 45%, milrinone started. CVP high started on IV lasix.   Improving. Scr down significantly, 2.28>>1.98>>1.19 today.  Lactic acidosis clearing, 5.3>>2.2.   Co-ox up to 65%. Excellent diuresis, -5L in UOP yesterday. K 3.1. Na 131  CVP 20-22, but in setting of severe TR.   Continues in Afib, rates 80s-low 100s. BP soft, low 100s systolic.   PCT elevated at 9.   No complaints today.    Objective:   Weight Range: 70.8 kg Body mass index is 21.17 kg/m.   Vital Signs:   Temp:  [98.3 F (36.8 C)-101 F (38.3 C)] 98.3 F (36.8 C) (09/17 0400) Pulse Rate:  [52-167] 99 (09/17 0720) Resp:  [12-31] 12 (09/17 0720) BP: (81-119)/(42-93) 106/61 (09/17 0720) SpO2:  [95 %-100 %] 95 % (09/17 0720)    Weight change: Filed Weights   02/06/20 0949 02/08/20 0607  Weight: 69.5 kg 70.8 kg    Intake/Output:   Intake/Output Summary (Last 24 hours) at 02/09/2020 0741 Last data filed at 02/09/2020 0700 Gross per 24 hour  Intake 2255.4 ml  Output 5050 ml  Net -2794.6 ml      Physical Exam   CVP 20-22 General:  Thin, young AAM sitting up in bed. No resp difficulty HEENT: Normal Neck: Supple. JVP elevated to ear . Carotids 2+ bilat; no bruits. No lymphadenopathy or thyromegaly appreciated. + RIJ CVC Cor: PMI nondisplaced. Irregularly irregular rate & rhythm. + TR murmur  Lungs: Clear bilaterally, no wheezing  Abdomen: Soft, nontender, nondistended. No hepatosplenomegaly. No bruits or masses. Good bowel sounds. Extremities: thin extremities, warm, no cyanosis, clubbing, rash, 1+ bilateral LEE Neuro: Alert & orientedx3, cranial nerves grossly intact. moves all 4 extremities w/o difficulty. Affect pleasant   Telemetry    A Fib/Flutter  80s-low 100s Personally reviewed  Labs    CBC Recent Labs    02/06/20 1025 02/07/20 1333 02/08/20 0435 02/09/20 0522  WBC 5.9   < > 10.4 9.7  NEUTROABS 4.3  --   --   --   HGB 12.7*   < > 11.6* 11.3*  HCT 38.7*   < > 36.1* 34.4*  MCV 90.0   < > 90.9 87.8  PLT 218   < > 152 153   < > = values in this interval not displayed.   Basic Metabolic Panel Recent Labs    34/74/25 0700 02/09/20 0522  NA 133* 131*  K 4.1 3.1*  CL 96* 92*  CO2 24 30  GLUCOSE 110* 104*  BUN 31* 20  CREATININE 1.98* 1.19  CALCIUM 8.1* 8.0*  MG  --  1.7   Liver Function Tests Recent Labs    02/07/20 1333 02/08/20 0700  AST 8,536* 8,423*  ALT 3,023* 3,691*  ALKPHOS 43 60  BILITOT 2.1* 2.2*  PROT 5.2* 6.1*  ALBUMIN 2.4* 2.9*   Recent Labs    02/06/20 1025  LIPASE 29   Cardiac Enzymes No results for input(s): CKTOTAL, CKMB, CKMBINDEX, TROPONINI in the last 72 hours.  BNP: BNP (last 3 results) Recent Labs    02/07/20 1450  BNP 862.2*    ProBNP (last 3 results) No results for input(s): PROBNP in the last 8760 hours.   D-Dimer No results for input(s): DDIMER in  the last 72 hours. Hemoglobin A1C No results for input(s): HGBA1C in the last 72 hours. Fasting Lipid Panel No results for input(s): CHOL, HDL, LDLCALC, TRIG, CHOLHDL, LDLDIRECT in the last 72 hours. Thyroid Function Tests Recent Labs    02/07/20 1500  TSH 8.762*    Other results:   Imaging    VAS [place] LOWER EXTREMITY VENOUS (DVT)  Result Date: 02/08/2020  Lower Venous DVTStudy Indications: Swelling, Right heart failure, Ebstein anomaly.  Comparison Study: No prior studies. Performing Technologist: Jean Rosenthal  Examination Guidelines: A complete evaluation includes B-mode imaging, spectral Doppler, color Doppler, and power Doppler as needed of all accessible portions of each vessel. Bilateral testing is considered an integral part of a complete examination. Limited examinations for reoccurring indications may be  performed as noted. The reflux portion of the exam is performed with the patient in reverse Trendelenburg.  +---------+---------------+---------+-----------+----------+--------------+ RIGHT    CompressibilityPhasicitySpontaneityPropertiesThrombus Aging +---------+---------------+---------+-----------+----------+--------------+ CFV      Full           Yes      Yes                                 +---------+---------------+---------+-----------+----------+--------------+ SFJ      Full                                                        +---------+---------------+---------+-----------+----------+--------------+ FV Prox  Full                                                        +---------+---------------+---------+-----------+----------+--------------+ FV Mid   Full                                                        +---------+---------------+---------+-----------+----------+--------------+ FV DistalFull                                                        +---------+---------------+---------+-----------+----------+--------------+ PFV      Full                                                        +---------+---------------+---------+-----------+----------+--------------+ POP      Full           Yes      Yes                                 +---------+---------------+---------+-----------+----------+--------------+ PTV      Full                                                        +---------+---------------+---------+-----------+----------+--------------+  PERO     Full                                                        +---------+---------------+---------+-----------+----------+--------------+   +---------+---------------+---------+-----------+----------+--------------+ LEFT     CompressibilityPhasicitySpontaneityPropertiesThrombus Aging +---------+---------------+---------+-----------+----------+--------------+ CFV      Full            Yes      Yes                                 +---------+---------------+---------+-----------+----------+--------------+ SFJ      Full                                                        +---------+---------------+---------+-----------+----------+--------------+ FV Prox  Full                                                        +---------+---------------+---------+-----------+----------+--------------+ FV Mid   Full                                                        +---------+---------------+---------+-----------+----------+--------------+ FV DistalFull                                                        +---------+---------------+---------+-----------+----------+--------------+ PFV      Full                                                        +---------+---------------+---------+-----------+----------+--------------+ POP      Full           Yes      Yes                                 +---------+---------------+---------+-----------+----------+--------------+ PTV      Full                                                        +---------+---------------+---------+-----------+----------+--------------+ PERO     Full                                                        +---------+---------------+---------+-----------+----------+--------------+  Summary: RIGHT: - There is no evidence of deep vein thrombosis in the lower extremity.  - No cystic structure found in the popliteal fossa.  LEFT: - There is no evidence of deep vein thrombosis in the lower extremity.  - No cystic structure found in the popliteal fossa.  *See table(s) above for measurements and observations. Electronically signed by Sherald Hess MD on 02/08/2020 at 4:17:40 PM.    Final      Medications:     Scheduled Medications: . Chlorhexidine Gluconate Cloth  6 each Topical Daily  . digoxin  0.125 mg Oral Daily  . feeding supplement (ENSURE ENLIVE)  237  mL Oral BID BM  . multivitamin with minerals  1 tablet Oral Daily  . nicotine  14 mg Transdermal Daily    Infusions: . amiodarone 60 mg/hr (02/09/20 0700)  . dextrose    . furosemide Stopped (02/08/20 1844)  . heparin 1,600 Units/hr (02/09/20 0700)  . milrinone 0.25 mcg/kg/min (02/09/20 0700)    PRN Medications: clonazepam, morphine injection, ondansetron **OR** ondansetron (ZOFRAN) IV    Patient Profile   Perry Cook a 34 year old malewith a hx of Ebstein's anomaly syndrome s/p repair 2011 whom we are asked to see by Dr. Addison Lank for cardiogenic shock  He has a hx ofEbstein's anomaly syndromewith repair at age 63yo.He reports he was initially diagnosed after collapsing during football practice.He states he was previously treated for unknown arrhythmia in the postsurgical setting with digoxin and beta-blocker. He was only on medication for short period of time.He has not followed with cardiology care in many years.   Assessment/Plan   1. 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 Ebstein anomaly s/p repair with severe TR and to-fro flow in hepatic veins - baseline RV function and degree of TR unclear suspect decompensation related to AFL  - admitted w/ cardiogenic shock, initial co-ox 45% - Started on milrinone, 0.25. CO-OX up to 65% today. Continue milrinone 0.25 mcg.  - good diuresis w/ IV Lasix, -5L out yesterday. AKI now resolved.  - CVP trending down but still high, 20-22, in setting of severe TR. Still fluid overloaded - Continue IV Lasix 120 mg bid  - No bb for now with shock.  - BP too soft for ARNi currently  - Add low dose spiro 12.5 mg daily  - continue digoxin 0.125 mg   2. Atrial flutter with RVR - remains in aflutter, V-rates improved - continue IV amio while on milrinone  - continue IV heparin  - eventual  TEE/DC-CV (if not more emergent) - Dr. Addison Lank has discussed with EP and seems to be 1:1 RA circuit - will eventually need TEE/DC-CV (vs ablation)  if does not convert with amio   3. AKI - due to shock - improved, SCr  2.28>>1.98>>1.19  4. Shock liver - due to cardiogenic shock. hepatitis panels negative - improving w/ inotropic support  - repeat HFP pending   5. Hyperkalemia/ Hypokalemia - treated w/ lokelma - K now low w/ diuresis, 3.1 today  - Supp w/ KCl, discussed w/ pharmacy  - start 12.5 of spiro  - follow BMP   6. 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 ID:  - PCT elevated at 9. mTemp 101. WBC 9.7  - CXR yesterday showed no active disease - UC NGTD   -  start empiric IV abx therapy w/ ceftriaxone    Length of Stay: 3  Brittainy Simmons, PA-C  02/09/2020, 7:41 AM  Advanced Heart Failure Team Pager [phone] (M-F; 7a - 4p)  Please contact [facility] for night-coverage after hours (4p -7a ) and weekends on [email or web address]  Agree with above.  Shock continues to resolve with milrinone support. Co-ox improving. Beginning to diurese but still markedly volume overloaded. Remains in AFL but rate improving with IV amio. Ab pain improved. PCT 9.0  General:  Weak appearing. No resp difficulty HEENT: normal Neck: supple. JVP to ear. Carotids 2+ bilat; no bruits. No lymphadenopathy or thryomegaly appreciated. Cor: PMI nondisplaced. Irreg tachyy + RV lift. 2/6 TR Lungs: clear Abdomen: soft, nontender, + distended. No hepatosplenomegaly. No bruits or masses. Good bowel sounds. Extremities: no cyanosis, clubbing, rash, 2-3+ edema Neuro: alert & orientedx3, cranial nerves grossly intact. moves all 4 extremities w/o difficulty. Affect pleasant  He has severe RV failure with shock in setting of previous Ebsteins repair and now AFL with RVR. Now improving with inotropic support. Will continue to diurese. Plan TEE/DC-CV  early next week. Continue heparin. Given PCT 9.0 would cover with abx though I think GB findings mostly due to RV failure. D/w CCM team.   CRITICAL CARE Performed by: Arvilla MeresBensimhon, Jaiden Wahab  Total critical care time: 35 minutes  Critical care time was exclusive of separately billable procedures and treating other patients.  Critical care was necessary to treat or prevent imminent or life-threatening deterioration.  Critical care was time spent personally by me (independent of midlevel providers or residents) on the following activities: development of treatment plan with patient and/or surrogate as well as nursing, discussions with consultants, evaluation of patient's response to treatment, examination of patient, obtaining history from patient or surrogate, ordering and performing treatments and interventions, ordering and review of laboratory studies, ordering and review of radiographic studies, pulse oximetry and re-evaluation of patient's condition.  Arvilla Meresaniel Dayron Odland, MD  2:29 PM

## 2020-02-09 NOTE — Progress Notes (Addendum)
ANTICOAGULATION CONSULT NOTE  Pharmacy Consult for Heparin Indication: Atrial flutter with RVR  No Known Allergies  Patient Measurements: Height: 6' (182.9 cm) Weight: 68.4 kg (150 lb 14.4 oz) IBW/kg (Calculated) : 77.6 Heparin dosing weight: 70 kg  Vital Signs: Temp: 98.3 F (36.8 C) (09/17 0400) Temp Source: Oral (09/17 0400) BP: 106/61 (09/17 0720) Pulse Rate: 99 (09/17 0720)  Labs: Recent Labs    02/07/20 1333 02/07/20 1333 02/07/20 1450 02/07/20 2013 02/08/20 0435 02/08/20 0435 02/08/20 0700 02/08/20 1230 02/08/20 2051 02/09/20 0522  HGB 10.9*   < >  --   --  11.6*  --   --   --   --  11.3*  HCT 36.1*  --   --   --  36.1*  --   --   --   --  34.4*  PLT 145*  --   --   --  152  --   --   --   --  153  HEPARINUNFRC  --   --   --   --  0.11*   < >  --  0.16* 0.29* 0.32  CREATININE 1.65*   < >  --  2.28*  --   --  1.98*  --   --  1.19  TROPONINIHS  --   --  22*  --   --   --   --   --   --   --    < > = values in this interval not displayed.    Estimated Creatinine Clearance: 84.6 mL/min (by C-G formula based on SCr of 1.19 mg/dL).   Medical History: Past Medical History:  Diagnosis Date  . Congenital heart disease    Epstein anomaly s/p repair 2002    Assessment: 34 year old malewith a hx of Ebstein's anomaly syndrome s/p repair 2011. Patient with atrial flutter with RVR in the setting of cardiogenic shock. Pharmacy consulted to initiate heparin therapy. Patient was not on any anticoagulation PTA.  Heparin level now therapeutic at 0.32 after increasing drip rate to 1600 units/hr. CBC stable with Hgb 11s, plts 150s. No bleeding or infusion issues per discussion with nursing.  Goal of Therapy:  Heparin level 0.3-0.7 units/ml Monitor platelets by anticoagulation protocol: Yes   Plan:  Increase heparin infusion to 1700 units/hr Check 6-hr cHL Monitor daily HL, CBC, s/sx bleeding    PM Update: Confirmatory heparin level therapeutic at 0.37. No bleeding  or infusion issues noted. Will continue heparin infusion at 1700 units/hr and check HL with AM labs.  Tama Headings, PharmD PGY2 Cardiology Pharmacy Resident Phone: [phone] 02/09/2020  8:00 AM  Please check [email or web address] for unit-specific pharmacy phone numbers.

## 2020-02-10 DIAGNOSIS — I50811 Acute right heart failure: Secondary | ICD-10-CM

## 2020-02-10 LAB — CBC
HCT: 34.7 % — ABNORMAL LOW (ref 39.0–52.0)
Hemoglobin: 11.5 g/dL — ABNORMAL LOW (ref 13.0–17.0)
MCH: 28.7 pg (ref 26.0–34.0)
MCHC: 33.1 g/dL (ref 30.0–36.0)
MCV: 86.5 fL (ref 80.0–100.0)
Platelets: 167 10*3/uL (ref 150–400)
RBC: 4.01 MIL/uL — ABNORMAL LOW (ref 4.22–5.81)
RDW: 14.9 % (ref 11.5–15.5)
WBC: 9.1 10*3/uL (ref 4.0–10.5)
nRBC: 1.8 % — ABNORMAL HIGH (ref 0.0–0.2)

## 2020-02-10 LAB — COOXEMETRY PANEL
Carboxyhemoglobin: 2.1 % — ABNORMAL HIGH (ref 0.5–1.5)
Carboxyhemoglobin: 1.9 % — ABNORMAL HIGH (ref 0.5–1.5)
Methemoglobin: 1.1 % (ref 0.0–1.5)
Methemoglobin: 1 % (ref 0.0–1.5)
O2 Saturation: 65.8 %
O2 Saturation: 58.5 %
Total hemoglobin: 12.1 g/dL (ref 12.0–16.0)
Total hemoglobin: 12.6 g/dL (ref 12.0–16.0)

## 2020-02-10 LAB — GLUCOSE, CAPILLARY
Glucose-Capillary: 114 mg/dL — ABNORMAL HIGH (ref 70–99)
Glucose-Capillary: 144 mg/dL — ABNORMAL HIGH (ref 70–99)
Glucose-Capillary: 135 mg/dL — ABNORMAL HIGH (ref 70–99)
Glucose-Capillary: 111 mg/dL — ABNORMAL HIGH (ref 70–99)
Glucose-Capillary: 108 mg/dL — ABNORMAL HIGH (ref 70–99)

## 2020-02-10 LAB — COMPREHENSIVE METABOLIC PANEL
ALT: 2183 U/L — ABNORMAL HIGH (ref 0–44)
AST: 2205 U/L — ABNORMAL HIGH (ref 15–41)
Albumin: 2.9 g/dL — ABNORMAL LOW (ref 3.5–5.0)
Alkaline Phosphatase: 72 U/L (ref 38–126)
Anion gap: 8 (ref 5–15)
BUN: 16 mg/dL (ref 6–20)
CO2: 31 mmol/L (ref 22–32)
Calcium: 8 mg/dL — ABNORMAL LOW (ref 8.9–10.3)
Chloride: 89 mmol/L — ABNORMAL LOW (ref 98–111)
Creatinine, Ser: 1.01 mg/dL (ref 0.61–1.24)
GFR calc Af Amer: >60 mL/min (ref >60)
GFR calc non Af Amer: >60 mL/min (ref >60)
Glucose, Bld: 105 mg/dL — ABNORMAL HIGH (ref 70–99)
Potassium: 3.7 mmol/L (ref 3.5–5.1)
Sodium: 128 mmol/L — ABNORMAL LOW (ref 135–145)
Total Bilirubin: 3.3 mg/dL — ABNORMAL HIGH (ref 0.3–1.2)
Total Protein: 6.3 g/dL — ABNORMAL LOW (ref 6.5–8.1)

## 2020-02-10 LAB — MAGNESIUM: Magnesium: 1.9 mg/dL (ref 1.7–2.4)

## 2020-02-10 LAB — PROCALCITONIN: Procalcitonin: 7.17 ng/mL

## 2020-02-10 LAB — HEPARIN LEVEL (UNFRACTIONATED): Heparin Unfractionated: 0.42 IU/mL (ref 0.30–0.70)

## 2020-02-10 MED ORDER — SPIRONOLACTONE 25 MG PO TABS
25.0000 mg | ORAL_TABLET | Freq: Every day | ORAL | Status: DC
  Administered 2020-02-10 – 2020-02-15 (×6): 25 mg via ORAL
  Filled 2020-02-10 (×6): qty 1

## 2020-02-10 MED ORDER — OXYMETAZOLINE HCL 0.05 % NA SOLN
1.0000 | Freq: Two times a day (BID) | NASAL | Status: DC
  Administered 2020-02-10 – 2020-02-18 (×15): 1 via NASAL
  Filled 2020-02-10: qty 30

## 2020-02-10 MED ORDER — WHITE PETROLATUM EX OINT
TOPICAL_OINTMENT | CUTANEOUS | Status: DC | PRN
  Administered 2020-02-11: 0.2 via TOPICAL
  Filled 2020-02-10: qty 28.35

## 2020-02-10 MED ORDER — MAGNESIUM SULFATE 2 GM/50ML IV SOLN
2.0000 g | Freq: Once | INTRAVENOUS | Status: AC
  Administered 2020-02-10: 2 g via INTRAVENOUS
  Filled 2020-02-10: qty 50

## 2020-02-10 NOTE — Plan of Care (Signed)
Patient reports that his appetite is better and denies any nausea today.  Patient reports that he was able to get out of bed and ambulate.  VS remain stable, 1st degree heart block on monitor  Problem: Education: Goal: Knowledge of General Education information will improve Description: Including pain rating scale, medication(s)/side effects and non-pharmacologic comfort measures Outcome: Progressing   Problem: Health Behavior/Discharge Planning: Goal: Ability to manage health-related needs will improve Outcome: Progressing   Problem: Clinical Measurements: Goal: Ability to maintain clinical measurements within normal limits will improve Outcome: Progressing Goal: Will remain free from infection Outcome: Progressing Goal: Diagnostic test results will improve Outcome: Progressing Goal: Respiratory complications will improve Outcome: Progressing Goal: Cardiovascular complication will be avoided Outcome: Progressing   Problem: Nutrition: Goal: Adequate nutrition will be maintained Outcome: Progressing

## 2020-02-10 NOTE — Progress Notes (Signed)
Advanced Heart Failure Rounding Note  PCP-Cardiologist: Thurmon Fair, MD   Subjective:     Milrinone increased to 0.375 yesterday due to co-ox 45%. Co-ox 66% today. Continues to diurese on IV lasix. Weight down 6 pounds overnight. CVP 18  Feeling better today. Breathing better. No further ab pain. Able to walk a bit this am.    Back in NSR on IV amio. On heparin. No bleeding.   LFTs coming down. AB pain better.    Objective:   Weight Range: 65.5 kg Body mass index is 19.6 kg/m.   Vital Signs:   Temp:  [98.2 F (36.8 C)-99.3 F (37.4 C)] 98.4 F (36.9 C) (09/18 0739) Pulse Rate:  [54-139] 70 (09/18 0800) Resp:  [10-25] 12 (09/18 0800) BP: (89-116)/(50-88) 100/67 (09/18 0800) SpO2:  [94 %-100 %] 96 % (09/18 0800) Weight:  [65.5 kg] 65.5 kg (09/18 0500)    Weight change: Filed Weights   02/08/20 0607 02/09/20 0756 02/10/20 0500  Weight: 70.8 kg 68.4 kg 65.5 kg    Intake/Output:   Intake/Output Summary (Last 24 hours) at 02/10/2020 0908 Last data filed at 02/10/2020 0700 Gross per 24 hour  Intake 1614.63 ml  Output 4675 ml  Net -3060.37 ml      Physical Exam   General:  Sitting up in bed No resp difficulty HEENT: normal Neck: supple. JVP to ear. Carotids 2+ bilat; no bruits. No lymphadenopathy or thryomegaly appreciated. Cor: PMI nondisplaced. Regular rate & rhythm 3/6 TR + RV lift Lungs: clear Abdomen: soft, nontender, nondistended. No hepatosplenomegaly. No bruits or masses. Good bowel sounds. Extremities: no cyanosis, clubbing, rash, tr edema Neuro: alert & orientedx3, cranial nerves grossly intact. moves all 4 extremities w/o difficulty. Affect pleasant  Telemetry   NSR 70-80 Personally reviewed  Labs    CBC Recent Labs    02/09/20 0522 02/10/20 0432  WBC 9.7 9.1  HGB 11.3* 11.5*  HCT 34.4* 34.7*  MCV 87.8 86.5  PLT 153 167   Basic Metabolic Panel Recent Labs    46/96/29 0522 02/09/20 0522 02/09/20 1437 02/10/20 0432  NA  131*   < > 132* 128*  K 3.1*   < > 3.9 3.7  CL 92*   < > 89* 89*  CO2 30   < > 32 31  GLUCOSE 104*   < > 120* 105*  BUN 20   < > 17 16  CREATININE 1.19   < > 1.10 1.01  CALCIUM 8.0*   < > 8.2* 8.0*  MG 1.7  --   --  1.9   < > = values in this interval not displayed.   Liver Function Tests Recent Labs    02/09/20 0752 02/10/20 0432  AST 3,953* 2,205*  ALT 2,722* 2,183*  ALKPHOS 69 72  BILITOT 2.7* 3.3*  PROT 6.1* 6.3*  ALBUMIN 2.8* 2.9*   No results for input(s): LIPASE, AMYLASE in the last 72 hours. Cardiac Enzymes No results for input(s): CKTOTAL, CKMB, CKMBINDEX, TROPONINI in the last 72 hours.  BNP: BNP (last 3 results) Recent Labs    02/07/20 1450  BNP 862.2*    ProBNP (last 3 results) No results for input(s): PROBNP in the last 8760 hours.   D-Dimer No results for input(s): DDIMER in the last 72 hours. Hemoglobin A1C No results for input(s): HGBA1C in the last 72 hours. Fasting Lipid Panel No results for input(s): CHOL, HDL, LDLCALC, TRIG, CHOLHDL, LDLDIRECT in the last 72 hours. Thyroid Function Tests Recent Labs  02/07/20 1500  TSH 8.762*    Other results:   Imaging    No results found.   Medications:     Scheduled Medications: . Chlorhexidine Gluconate Cloth  6 each Topical Daily  . digoxin  0.125 mg Oral Daily  . feeding supplement (ENSURE ENLIVE)  237 mL Oral BID BM  . multivitamin with minerals  1 tablet Oral Daily  . nicotine  14 mg Transdermal Daily  . spironolactone  12.5 mg Oral Daily    Infusions: . amiodarone 60 mg/hr (02/10/20 0805)  . cefTRIAXone (ROCEPHIN)  IV Stopped (02/09/20 1251)  . dextrose    . furosemide 120 mg (02/10/20 0806)  . heparin 1,700 Units/hr (02/10/20 0748)  . milrinone 0.25 mcg/kg/min (02/09/20 1900)  . milrinone 0.375 mcg/kg/min (02/10/20 0700)    PRN Medications: alum & mag hydroxide-simeth, clonazepam, guaiFENesin-dextromethorphan, morphine injection, ondansetron **OR** ondansetron (ZOFRAN)  IV    Patient Profile   [name] a 34 year old malewith a hx of Ebstein's anomaly syndrome s/p repair 2011 whom we are asked to see by Dr. Addison Lank for cardiogenic shock  He has a hx ofEbstein's anomaly syndromewith repair at age 34yo.He reports he was initially diagnosed after collapsing during football practice.He states he was previously treated for unknown arrhythmia in the postsurgical setting with digoxin and beta-blocker. He was only on medication for short period of time.He has not followed with cardiology care in many years.   Assessment/Plan   1. Cariogenic shock with severe lactic acidosis in setting of Ebstein's anomaly s/p repair with advanced RHF - echo 02/07/20 LVEF 50% (read as 35% but I do not agree) RV massively dilated and severely HK. Ebstein anomaly s/p repair with severe TR and to-fro flow in hepatic veins - baseline RV function and degree of TR unclear suspect decompensation related to AFL  - admitted w/ cardiogenic shock, initial co-ox 45% - Started on milrinone, 0.25. CO-OX down to 45% on 9/17. Milrinone increased to 0.375. Co-ox 66% this am  - Diuresing well. Volume status improving but CVP still 188 - Continue IV lasix. Increase sprio to 25 - continue digoxin 0.125 mg   2. Atrial flutter with RVR - converted to NSR overnight on IV amio - continue IV amio while on milrinone  - continue IV heparin  - Dr. Addison Lank has discussed with EP and seems to be 1:1 RA circuit. Can consider eventual ablation  3. AKI - due to shock - improved, SCr  2.28>>1.98>>1.19 -> 1.01  4. Shock liver - due to cardiogenic shock. hepatitis panels negative - improving w/ inotropic support   5. Hyperkalemia/ Hypokalemia - improved Keep K > 4.0  6. Severe TR - Ebstein anomaly s/p repair with severe TR - will need TEE on Monday to better assess   7. ID:  - PCT elevated at 9. mTemp 99.3  - On CTX for possible cholecystitis though I suspect GB issues  due to severe RV failure  CRITICAL CARE Performed by: Arvilla Meres  Total critical care time: 35 minutes  Critical care time was exclusive of separately billable procedures and treating other patients.  Critical care was necessary to treat or prevent imminent or life-threatening deterioration.  Critical care was time spent personally by me (independent of midlevel providers or residents) on the following activities: development of treatment plan with patient and/or surrogate as well as nursing, discussions with consultants, evaluation of patient's response to treatment, examination of patient, obtaining history from patient or surrogate, ordering and performing treatments and interventions, ordering and  review of laboratory studies, ordering and review of radiographic studies, pulse oximetry and re-evaluation of patient's condition.   Length of Stay: 4  Arvilla Meres, MD  02/10/2020, 9:08 AM  Advanced Heart Failure Team Pager [phone] (M-F; 7a - 4p)  Please contact [facility] for night-coverage after hours (4p -7a ) and weekends on [email or web address]

## 2020-02-10 NOTE — Progress Notes (Addendum)
ANTICOAGULATION CONSULT NOTE  Pharmacy Consult for Heparin Indication: Atrial flutter with RVR  No Known Allergies  Patient Measurements: Height: 6' (182.9 cm) Weight: 65.5 kg (144 lb 8 oz) IBW/kg (Calculated) : 77.6 Heparin dosing weight: 70 kg  Vital Signs: Temp: 98.1 F (36.7 C) (09/18 1134) Temp Source: Oral (09/18 0338) BP: 106/69 (09/18 0900) Pulse Rate: 72 (09/18 0900)  Labs: Recent Labs    02/07/20 1450 02/07/20 2013 02/08/20 0435 02/08/20 0700 02/09/20 0522 02/09/20 1437 02/10/20 0432  HGB  --   --  11.6*   < > 11.3*  --  11.5*  HCT  --   --  36.1*  --  34.4*  --  34.7*  PLT  --   --  152  --  153  --  167  HEPARINUNFRC  --   --  0.11*   < > 0.32 0.37 0.42  CREATININE  --    < >  --    < > 1.19 1.10 1.01  TROPONINIHS 22*  --   --   --   --   --   --    < > = values in this interval not displayed.    Estimated Creatinine Clearance: 95.5 mL/min (by C-G formula based on SCr of 1.01 mg/dL).   Medical History: Past Medical History:  Diagnosis Date  . Congenital heart disease    Epstein anomaly s/p repair 2002    Assessment: 33 year old malewith a hx of Ebstein's anomaly syndrome s/p repair 2011. Patient with atrial flutter with RVR in the setting of cardiogenic shock. Pharmacy consulted to initiate heparin therapy. Patient was not on any anticoagulation PTA.  Heparin level now therapeutic at 0.42 after increasing drip rate to 1600 units/hr. CBC stable. No bleeding or infusion issues per discussion with nursing.  Goal of Therapy:  Heparin level 0.3-0.7 units/ml Monitor platelets by anticoagulation protocol: Yes   Plan:  Continue heparin infusion at 1700 units/hr Check 6-hr cHL Monitor daily HL, CBC, s/sx bleeding    Reece Leader, Colon Flattery, [facility] Clinical Pharmacist  02/10/2020 11:45 AM   [facility] pharmacy phone numbers are listed on [email or web address]  Addendum: Pt with fairly significant nosebleed this afternoon. Spoke to Drs. Icard, Bensimhon.  No  packing for now.  Will attempt to keep heparin running since recently converted to sinus overnight on amiodarone.  Turn heparin down just a little to 1650 units/hr for now.  Will continue to assess nosebleed.  Reece Leader, Colon Flattery, [facility] Clinical Pharmacist  02/10/2020 2:17 PM   [facility] pharmacy phone numbers are listed on [email or web address]

## 2020-02-10 NOTE — Progress Notes (Signed)
PCCM:  Patient transferred to advanced HF service.   Pulmonary will be available.   Please call if needed  Josephine Igo, DO [facility] 02/10/2020 5:36 PM

## 2020-02-10 NOTE — Progress Notes (Signed)
NAME:  Perry Cook, MRN:  [id number], DOB:  27-Aug-1985, LOS: 4 ADMISSION DATE:  02/06/2020, CONSULTATION DATE: 02/07/20 REFERRING MD:  Dr. Rito Ehrlich, CHIEF COMPLAINT:  Abd pain, nausea  Brief History   34 y/o M with history of Ebstein's anomaly s/p repair 2002 presenting at [facility] complaining of RUQ abdominal pain for the past month, found to be in recurrent AFL who developed cardiogenic shock.  Transferred to [facility] 9/15, HF team and cardiology following.   History of present illness   34 y/o M with PMH of history of Ebstein's anomaly who presented to [facility] ER on 9/14 with RUQ abdominal pain and intermittent palpitations.  He has been experiencing RUQ abdominal pain for about a month.  He has noticed a decrease in his appetite due to nausea and admits to intermittent diarrhea.  He also has noted palpitations when resting.  He works at [employer] and has been experiencing shortness of breath with exertion. Pt reports history of vaping and 1-2 alcoholic drinks per day "but more when out at a show".  He states that he has not vaped or had any alcohol for the past month because he felt so poorly.   In the [facility] ED he was found to be in SVT with a HR of 177 bpm, which converted to NSR briefly after adenosine and metoprolol. Heart rate has been elevated in 140's-150's since 1900 9/14 with transient hypotension with SBP in 80's.  Abdominal CT showed hepatomegaly, indicative of hepatic steatosis, with significant passive hepatic congestion.   Labs significant for K+: 6.0, Glucose: 25~53 (required dextrose IV)~135, AG: 19, BUN: 22, Cr 0.9 which rose to 1.82, AST: > 10,000, ALT: 4,002, Bili: 1.4.    He was initially diagnosed with Ebstein's after collapsing at football practice. 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  Pt denies recent injuries, falls, fevers/chills, weight gain, LE swelling.   Past Medical History  CHD-  Ebstein's anomaly syndrome s/p repair in 2002 (has not followed with cardiology for many years)  Significant Hospital Events   9/14 Admit  9/15 PCCM consulted; tx to [facility] 2H  Consults:  Cardiology HF CCS   Procedures:  9/15 R IJ CVL >>   Significant Diagnostic Tests:  9/14 CT ABD / Pelvis >> cardiomegaly with severe right atrial dilatation and reflux of contrast media into the liver with hepatomegaly, hepatic steatosis, small volume ascites 9/14 [place] ABD >> no gallstones, gallbladder wall thickening with edema and pericholecystic fluid, mild ascites, bidirectional flow in the portal vein 9/14 ECHO >>  LVEF 35%, RV massively dilated and severely HK; Ebstein anomaly s/p repair with severe TR and to-fro in hepatic veins 9/15 LE Doppler >>  9/15 HIDA Scan >>   Micro Data:  COVID 9/14 >> negative  UC 9/14 >>  UA 9/14 >> negative  Antimicrobials:  Ceftriaxone 9/15 >>   Interim history/subjective:   No issues overnight. Stable. Good UOP.   Objective   Blood pressure 106/69, pulse 72, temperature 98.4 F (36.9 C), resp. rate 20, height 6' (1.829 m), weight 65.5 kg, SpO2 95 %. CVP:  [20 mmHg-28 mmHg] 23 mmHg      Intake/Output Summary (Last 24 hours) at 02/10/2020 0956 Last data filed at 02/10/2020 0900 Gross per 24 hour  Intake 1785.36 ml  Output 4500 ml  Net -2714.64 ml   Filed Weights   02/08/20 0607 02/09/20 0756 02/10/20 0500  Weight:  70.8 kg 68.4 kg 65.5 kg    Examination: General:  Male, resting in bed, no distress HEENT: +JVP still visible  Neuro: Alert, following commands, no deficit  CV: irregularly irregular, s1 s2 PULM: CTAB no wheeze  GI: Soft, nt, mildly distended  Extremities: minimal edema  Skin: no rash   CXR: from yesterday   Resolved Hospital Problem list      Assessment & Plan:   Cardiogenic Shock in setting of Ebstein's Anomaly s/p Repair  Acute on Chronic Right-sided Heart Failure In the setting of Ebstein's anomaly syndrome s/p repair  (2002).  Pt prescribed digoxin and toprol XL with non compliance since 2004. CT showed cardiomegaly and severely dilated right atrium, severe TR and bi-directional flow in hepatic veins. Appreciate input from heart failure service. -Continue telemetry, ICU level care -Milrinone, Lasix, digoxin, spironolactone -Follow urine output Mean arterial pressure goal greater than 65 mmHg.  Atrial Flutter with RVR  -Amiodarone plus digoxin plus heparin  Shock Liver secondary to acute right heart failure  Possible acalculous cholecystitis vs passive hepatic congestion Abdominal CT showed hepatomegaly (19.8 cm) with findings indicative of hepatic steatosis and significant passive hepatic congestion.  Tylenol level negative. -Holding off on any further evaluation with HIDA scan. -Suspect related to vascular congestion of liver. -Continue to improve, observe with diuresis  Positive Pro-cal  -Unclear etiology, no obvious source of sepsis.  Possibly related to liver injury -Continue empiric antimicrobials  Acute Kidney Injury in the setting of Metabolic Acidosis with Hyperkalemia Lactic Acidosis  -Follow electrolytes -Follow urine output and basic metabolic panel  Normocytic Anemia  Thrombocytopenia  -Follow CBC -Conservative transfusion threshold for hemoglobin less than 7  Hypoglycemia -Stable likely related to hepatic injury  Hypocalcemia  Observe  Best practice:  Diet: Heart healthy as tolerated  Pain/Anxiety/Delirium protocol (if indicated): n/a  VAP protocol (if indicated): n/a  DVT prophylaxis: heparin  GI prophylaxis: n/a  Glucose control: CBG Q4 Mobility: As tolerated  Code Status: Full Code  Family Communication: family at bedside/patient  Disposition: ICU, 2H for advanced heart failure   Labs   CBC: Recent Labs  Lab 02/06/20 1025 02/07/20 1333 02/08/20 0435 02/09/20 0522 02/10/20 0432  WBC 5.9 14.1* 10.4 9.7 9.1  NEUTROABS 4.3  --   --   --   --   HGB 12.7* 10.9*  11.6* 11.3* 11.5*  HCT 38.7* 36.1* 36.1* 34.4* 34.7*  MCV 90.0 97.3 90.9 87.8 86.5  PLT 218 145* 152 153 167    Basic Metabolic Panel: Recent Labs  Lab 02/07/20 2013 02/08/20 0700 02/09/20 0522 02/09/20 1437 02/10/20 0432  NA 130* 133* 131* 132* 128*  K 5.1 4.1 3.1* 3.9 3.7  CL 97* 96* 92* 89* 89*  CO2 20* 24 30 32 31  GLUCOSE 140* 110* 104* 120* 105*  BUN 29* 31* 20 17 16   CREATININE 2.28* 1.98* 1.19 1.10 1.01  CALCIUM 8.0* 8.1* 8.0* 8.2* 8.0*  MG  --   --  1.7  --  1.9   GFR: Estimated Creatinine Clearance: 95.5 mL/min (by C-G formula based on SCr of 1.01 mg/dL). Recent Labs  Lab 02/06/20 1025 02/07/20 1332 02/07/20 1333 02/07/20 2013 02/07/20 2231 02/08/20 0435 02/08/20 0700 02/09/20 0522 02/10/20 0432  PROCALCITON  --   --   --   --   --   --   --  9.00 7.17  WBC   < >  --  14.1*  --   --  10.4  --  9.7 9.1  LATICACIDVEN  --  7.2*  --  5.3* 4.7*  --  2.2*  --   --    < > = values in this interval not displayed.    Liver Function Tests: Recent Labs  Lab 02/07/20 0531 02/07/20 1333 02/08/20 0700 02/09/20 0752 02/10/20 0432  AST >10,000* 8,536* 8,423* 3,953* 2,205*  ALT 4,002* 3,023* 3,691* 2,722* 2,183*  ALKPHOS 60 43 60 69 72  BILITOT 2.7* 2.1* 2.2* 2.7* 3.3*  PROT 7.4 5.2* 6.1* 6.1* 6.3*  ALBUMIN 3.6 2.4* 2.9* 2.8* 2.9*   Recent Labs  Lab 02/06/20 1025  LIPASE 29   No results for input(s): AMMONIA in the last 168 hours.  ABG    Component Value Date/Time   O2SAT 65.8 02/10/2020 0428     Coagulation Profile: No results for input(s): INR, PROTIME in the last 168 hours.  Cardiac Enzymes: No results for input(s): CKTOTAL, CKMB, CKMBINDEX, TROPONINI in the last 168 hours.  HbA1C: No results found for: HGBA1C  CBG: Recent Labs  Lab 02/09/20 1549 02/09/20 1931 02/09/20 2340 02/10/20 0336 02/10/20 0737  GLUCAP 90 96 118* 114* 144*     This patient is critically ill with multiple organ system failure; which, requires frequent high  complexity decision making, assessment, support, evaluation, and titration of therapies. This was completed through the application of advanced monitoring technologies and extensive interpretation of multiple databases. During this encounter critical care time was devoted to patient care services described in this note for 32 minutes.  Josephine Igo, DO [facility] 02/10/2020 9:56 AM

## 2020-02-11 DIAGNOSIS — R7401 Elevation of levels of liver transaminase levels: Secondary | ICD-10-CM

## 2020-02-11 DIAGNOSIS — I471 Supraventricular tachycardia: Secondary | ICD-10-CM

## 2020-02-11 DIAGNOSIS — F17200 Nicotine dependence, unspecified, uncomplicated: Secondary | ICD-10-CM

## 2020-02-11 LAB — CBC
HCT: 35 % — ABNORMAL LOW (ref 39.0–52.0)
Hemoglobin: 11.8 g/dL — ABNORMAL LOW (ref 13.0–17.0)
MCH: 28.4 pg (ref 26.0–34.0)
MCHC: 33.7 g/dL (ref 30.0–36.0)
MCV: 84.1 fL (ref 80.0–100.0)
Platelets: 165 10*3/uL (ref 150–400)
RBC: 4.16 MIL/uL — ABNORMAL LOW (ref 4.22–5.81)
RDW: 14.8 % (ref 11.5–15.5)
WBC: 7.5 10*3/uL (ref 4.0–10.5)
nRBC: 1.1 % — ABNORMAL HIGH (ref 0.0–0.2)

## 2020-02-11 LAB — GLUCOSE, CAPILLARY
Glucose-Capillary: 105 mg/dL — ABNORMAL HIGH (ref 70–99)
Glucose-Capillary: 146 mg/dL — ABNORMAL HIGH (ref 70–99)
Glucose-Capillary: 80 mg/dL (ref 70–99)
Glucose-Capillary: 121 mg/dL — ABNORMAL HIGH (ref 70–99)

## 2020-02-11 LAB — COMPREHENSIVE METABOLIC PANEL
ALT: 1797 U/L — ABNORMAL HIGH (ref 0–44)
AST: 1390 U/L — ABNORMAL HIGH (ref 15–41)
Albumin: 3.1 g/dL — ABNORMAL LOW (ref 3.5–5.0)
Alkaline Phosphatase: 82 U/L (ref 38–126)
Anion gap: 9 (ref 5–15)
BUN: 14 mg/dL (ref 6–20)
CO2: 31 mmol/L (ref 22–32)
Calcium: 8.1 mg/dL — ABNORMAL LOW (ref 8.9–10.3)
Chloride: 87 mmol/L — ABNORMAL LOW (ref 98–111)
Creatinine, Ser: 1.02 mg/dL (ref 0.61–1.24)
GFR calc Af Amer: >60 mL/min (ref >60)
GFR calc non Af Amer: >60 mL/min (ref >60)
Glucose, Bld: 107 mg/dL — ABNORMAL HIGH (ref 70–99)
Potassium: 3.4 mmol/L — ABNORMAL LOW (ref 3.5–5.1)
Sodium: 127 mmol/L — ABNORMAL LOW (ref 135–145)
Total Bilirubin: 3.2 mg/dL — ABNORMAL HIGH (ref 0.3–1.2)
Total Protein: 7.3 g/dL (ref 6.5–8.1)

## 2020-02-11 LAB — COOXEMETRY PANEL
Carboxyhemoglobin: 2.5 % — ABNORMAL HIGH (ref 0.5–1.5)
Carboxyhemoglobin: 2.3 % — ABNORMAL HIGH (ref 0.5–1.5)
Carboxyhemoglobin: 1.4 % (ref 0.5–1.5)
Methemoglobin: 0.9 % (ref 0.0–1.5)
Methemoglobin: 1 % (ref 0.0–1.5)
Methemoglobin: 0.7 % (ref 0.0–1.5)
O2 Saturation: 89.6 %
O2 Saturation: 73.7 %
O2 Saturation: 59 %
Total hemoglobin: 12.6 g/dL (ref 12.0–16.0)
Total hemoglobin: 12.6 g/dL (ref 12.0–16.0)
Total hemoglobin: 12.8 g/dL (ref 12.0–16.0)

## 2020-02-11 LAB — PROCALCITONIN: Procalcitonin: 4.94 ng/mL

## 2020-02-11 LAB — HEPARIN LEVEL (UNFRACTIONATED): Heparin Unfractionated: 0.5 IU/mL (ref 0.30–0.70)

## 2020-02-11 MED ORDER — POTASSIUM CHLORIDE CRYS ER 20 MEQ PO TBCR
40.0000 meq | EXTENDED_RELEASE_TABLET | ORAL | Status: AC
  Administered 2020-02-11 (×3): 40 meq via ORAL
  Filled 2020-02-11 (×3): qty 2

## 2020-02-11 NOTE — Progress Notes (Addendum)
Advanced Heart Failure Rounding Note  PCP-Cardiologist: Thurmon Fair, MD   Subjective:    Remains on milrinone 0.375. Co-ox 74%. Continues to diurese on IV lasix. Weight down another 5 pounds overnight. Down 17 pounds total.   Feeling better. No SOB,orthopnea or PND. Very down.   On IV amio 60 and heparin. In NSR but Qt .  Had severe epistaxis last night. Heparin turned down slightly. Now resolved  CVP 15 with prominent v-waves  LFTs coming down.    Objective:   Weight Range: 63.3 kg Body mass index is 18.93 kg/m.   Vital Signs:   Temp:  [98.1 F (36.7 C)-98.7 F (37.1 C)] 98.3 F (36.8 C) (09/19 0728) Pulse Rate:  [64-75] 69 (09/19 0715) Resp:  [11-27] 16 (09/19 0715) BP: (90-119)/(54-86) 115/77 (09/19 0715) SpO2:  [94 %-100 %] 99 % (09/19 0715) Weight:  [63.3 kg] 63.3 kg (09/19 0545)    Weight change: Filed Weights   02/09/20 0756 02/10/20 0500 02/11/20 0545  Weight: 68.4 kg 65.5 kg 63.3 kg    Intake/Output:   Intake/Output Summary (Last 24 hours) at 02/11/2020 0917 Last data filed at 02/11/2020 0730 Gross per 24 hour  Intake 2297.52 ml  Output 5000 ml  Net -2702.48 ml      Physical Exam   General:  Sitting up in bed. No resp difficulty HEENT: normal Neck: supple. JVP to jaw  Carotids 2+ bilat; no bruits. No lymphadenopathy or thryomegaly appreciated. Cor: PMI nondisplaced. Regular rate & rhythm. 2/6 TR + RV lift Lungs: clear Abdomen: soft, nontender, nondistended. No hepatosplenomegaly. No bruits or masses. Good bowel sounds. Extremities: no cyanosis, clubbing, rash, edema Neuro: alert & orientedx3, cranial nerves grossly intact. moves all 4 extremities w/o difficulty. Affect pleasant   Telemetry   NSR 60-70s Personally reviewed  Labs    CBC Recent Labs    02/10/20 0432 02/11/20 0458  WBC 9.1 7.5  HGB 11.5* 11.8*  HCT 34.7* 35.0*  MCV 86.5 84.1  PLT 167 165   Basic Metabolic Panel Recent Labs    16/10/96 0522  02/09/20 1437 02/10/20 0432 02/11/20 0458  NA 131*   < > 128* 127*  K 3.1*   < > 3.7 3.4*  CL 92*   < > 89* 87*  CO2 30   < > 31 31  GLUCOSE 104*   < > 105* 107*  BUN 20   < > 16 14  CREATININE 1.19   < > 1.01 1.02  CALCIUM 8.0*   < > 8.0* 8.1*  MG 1.7  --  1.9  --    < > = values in this interval not displayed.   Liver Function Tests Recent Labs    02/10/20 0432 02/11/20 0458  AST 2,205* 1,390*  ALT 2,183* 1,797*  ALKPHOS 72 82  BILITOT 3.3* 3.2*  PROT 6.3* 7.3  ALBUMIN 2.9* 3.1*   No results for input(s): LIPASE, AMYLASE in the last 72 hours. Cardiac Enzymes No results for input(s): CKTOTAL, CKMB, CKMBINDEX, TROPONINI in the last 72 hours.  BNP: BNP (last 3 results) Recent Labs    02/07/20 1450  BNP 862.2*    ProBNP (last 3 results) No results for input(s): PROBNP in the last 8760 hours.   D-Dimer No results for input(s): DDIMER in the last 72 hours. Hemoglobin A1C No results for input(s): HGBA1C in the last 72 hours. Fasting Lipid Panel No results for input(s): CHOL, HDL, LDLCALC, TRIG, CHOLHDL, LDLDIRECT in the last 72 hours. Thyroid  Function Tests No results for input(s): TSH, T4TOTAL, T3FREE, THYROIDAB in the last 72 hours.  Invalid input(s): FREET3  Other results:   Imaging    No results found.   Medications:     Scheduled Medications: . Chlorhexidine Gluconate Cloth  6 each Topical Daily  . digoxin  0.125 mg Oral Daily  . feeding supplement (ENSURE ENLIVE)  237 mL Oral BID BM  . multivitamin with minerals  1 tablet Oral Daily  . nicotine  14 mg Transdermal Daily  . oxymetazoline  1 spray Each Nare BID  . spironolactone  25 mg Oral Daily    Infusions: . amiodarone 60 mg/hr (02/11/20 0902)  . cefTRIAXone (ROCEPHIN)  IV Stopped (02/10/20 1257)  . dextrose    . furosemide 120 mg (02/11/20 0745)  . heparin 1,650 Units/hr (02/11/20 0700)  . milrinone 0.375 mcg/kg/min (02/11/20 0700)    PRN Medications: alum & mag  hydroxide-simeth, clonazepam, guaiFENesin-dextromethorphan, morphine injection, ondansetron **OR** ondansetron (ZOFRAN) IV, white petrolatum    Patient Profile   [name] a 34 year old malewith a hx of Ebstein's anomaly syndrome s/p repair 2011 whom we are asked to see by Dr. Addison Lank for cardiogenic shock  He has a hx ofEbstein's anomaly syndromewith repair at age 89yo.He reports he was initially diagnosed after collapsing during football practice.He states he was previously treated for unknown arrhythmia in the postsurgical setting with digoxin and beta-blocker. He was only on medication for short period of time.He has not followed with cardiology care in many years.   Assessment/Plan   1. 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 Ebstein anomaly s/p repair with severe TR and to-fro flow in hepatic veins - baseline RV function and degree of TR unclear suspect decompensation related to AFL  - admitted w/ cardiogenic shock, initial co-ox 45% - Started on milrinone, 0.25. CO-OX down to 45% on 9/17. Milrinone increased to 0.375. Co-ox 74% this am. Turn milrinone down to 0.25 - Diuresing well. Volume status improving but CVP still 15. Continue IV diuresis one more day - Continue spiro 25 - continue digoxin 0.125 mg  - TEE tomorrow   2. Atrial flutter with RVR - converted to NSR on IV amio - continue IV amio while on milrinone. Turn down to 30. Watch QT - continue IV heparin  - Dr. Addison Lank has discussed with EP and seems to be 1:1 RA circuit. Can consider eventual ablation  3. AKI - due to shock - resolved  4. Shock liver - due to cardiogenic shock. hepatitis panels negative - improving w/ inotropic support   5. Hyperkalemia/ Hypokalemia - improved Keep K > 4.0  6. Severe TR - Ebstein anomaly s/p repair with severe TR -  will need TEE tomorrow o better assess   7. ID:  - PCT elevated at 9.0 - On CTX for possible cholecystitis though I suspect GB issues due to severe RV failure - No AF. PCT coming down.  - Would give 7 days total  8. Prolonged QT - decrease amio to 30 - avoid all QT prolonging meds - d/w PharmD and MAR adjusted.   9. Depression - says he has tried anti-depressants before and they don't help. Currently not a candidate for one with QT prolongation as well.   Length of Stay: 5  Arvilla Meres, MD  02/11/2020, 9:17 AM  Advanced Heart Failure Team Pager [phone] (M-F; 7a - 4p)  Please contact [facility] for night-coverage after hours (4p -7a ) and weekends on [email or web address]

## 2020-02-11 NOTE — Progress Notes (Signed)
ANTICOAGULATION CONSULT NOTE  Pharmacy Consult for Heparin Indication: Atrial flutter with RVR  No Known Allergies  Patient Measurements: Height: 6' (182.9 cm) Weight: 63.3 kg (139 lb 9.6 oz) IBW/kg (Calculated) : 77.6 Heparin dosing weight: 70 kg  Vital Signs: Temp: 98.3 F (36.8 C) (09/19 0728) Temp Source: Oral (09/19 0347) BP: 115/77 (09/19 0715) Pulse Rate: 69 (09/19 0715)  Labs: Recent Labs    02/09/20 0522 02/09/20 0522 02/09/20 1437 02/10/20 0432 02/11/20 0458 02/11/20 0459  HGB 11.3*   < >  --  11.5* 11.8*  --   HCT 34.4*  --   --  34.7* 35.0*  --   PLT 153  --   --  167 165  --   HEPARINUNFRC 0.32   < > 0.37 0.42  --  0.50  CREATININE 1.19   < > 1.10 1.01 1.02  --    < > = values in this interval not displayed.    Estimated Creatinine Clearance: 91.4 mL/min (by C-G formula based on SCr of 1.02 mg/dL).   Medical History: Past Medical History:  Diagnosis Date   Congenital heart disease    Epstein anomaly s/p repair 2002    Assessment: 34 year old malewith a hx of Ebstein's anomaly syndrome s/p repair 2011. Patient with atrial flutter with RVR in the setting of cardiogenic shock. Pharmacy consulted to initiate heparin therapy. Patient was not on any anticoagulation PTA.  Heparin level now therapeutic at 0.5 after decreasing drip rate to 1650 units/hr yesterday for nosebleeds.  Nosebleeds improved with addition of afrin. CBC stable today. No bleeding or infusion issues per discussion with nursing.  Goal of Therapy:  Heparin level 0.3-0.7 units/ml Monitor platelets by anticoagulation protocol: Yes   Plan:  Continue heparin infusion at 1650 units/hr Daily heparin level and CBC. F/u nosebleeds.  Reece Leader, Colon Flattery, [facility] Clinical Pharmacist  02/11/2020 9:44 AM   [facility] pharmacy phone numbers are listed on [email or web address]

## 2020-02-12 ENCOUNTER — Encounter (HOSPITAL_COMMUNITY)
Admission: EM | Disposition: A | Discharge status: Home / Self Care | Payer: Self-pay | Source: Home / Self Care | Attending: Internal Medicine

## 2020-02-12 ENCOUNTER — Encounter (HOSPITAL_COMMUNITY): Payer: Self-pay | Admitting: Pulmonary Disease

## 2020-02-12 ENCOUNTER — Inpatient Hospital Stay (HOSPITAL_COMMUNITY): Payer: Self-pay | Admitting: Certified Registered"

## 2020-02-12 ENCOUNTER — Inpatient Hospital Stay (HOSPITAL_COMMUNITY): Payer: Self-pay

## 2020-02-12 DIAGNOSIS — I361 Nonrheumatic tricuspid (valve) insufficiency: Secondary | ICD-10-CM

## 2020-02-12 DIAGNOSIS — E43 Unspecified severe protein-calorie malnutrition: Secondary | ICD-10-CM | POA: Insufficient documentation

## 2020-02-12 DIAGNOSIS — I340 Nonrheumatic mitral (valve) insufficiency: Secondary | ICD-10-CM

## 2020-02-12 HISTORY — PX: TEE WITHOUT CARDIOVERSION: SHX5443

## 2020-02-12 LAB — COMPREHENSIVE METABOLIC PANEL
ALT: 1581 U/L — ABNORMAL HIGH (ref 0–44)
AST: 918 U/L — ABNORMAL HIGH (ref 15–41)
Albumin: 3.4 g/dL — ABNORMAL LOW (ref 3.5–5.0)
Alkaline Phosphatase: 89 U/L (ref 38–126)
Anion gap: 8 (ref 5–15)
BUN: 20 mg/dL (ref 6–20)
CO2: 29 mmol/L (ref 22–32)
Calcium: 8.8 mg/dL — ABNORMAL LOW (ref 8.9–10.3)
Chloride: 89 mmol/L — ABNORMAL LOW (ref 98–111)
Creatinine, Ser: 1.14 mg/dL (ref 0.61–1.24)
GFR calc Af Amer: >60 mL/min (ref >60)
GFR calc non Af Amer: >60 mL/min (ref >60)
Glucose, Bld: 100 mg/dL — ABNORMAL HIGH (ref 70–99)
Potassium: 4.7 mmol/L (ref 3.5–5.1)
Sodium: 126 mmol/L — ABNORMAL LOW (ref 135–145)
Total Bilirubin: 2.3 mg/dL — ABNORMAL HIGH (ref 0.3–1.2)
Total Protein: 8.2 g/dL — ABNORMAL HIGH (ref 6.5–8.1)

## 2020-02-12 LAB — COOXEMETRY PANEL
Carboxyhemoglobin: 1.8 % — ABNORMAL HIGH (ref 0.5–1.5)
Carboxyhemoglobin: 1.2 % (ref 0.5–1.5)
Methemoglobin: 0.9 % (ref 0.0–1.5)
Methemoglobin: 0.6 % (ref 0.0–1.5)
O2 Saturation: 63.5 %
O2 Saturation: 57 %
Total hemoglobin: 13.8 g/dL (ref 12.0–16.0)
Total hemoglobin: 13.5 g/dL (ref 12.0–16.0)

## 2020-02-12 LAB — CBC
HCT: 39.1 % (ref 39.0–52.0)
Hemoglobin: 13.3 g/dL (ref 13.0–17.0)
MCH: 28.9 pg (ref 26.0–34.0)
MCHC: 34 g/dL (ref 30.0–36.0)
MCV: 84.8 fL (ref 80.0–100.0)
Platelets: 194 10*3/uL (ref 150–400)
RBC: 4.61 MIL/uL (ref 4.22–5.81)
RDW: 14.9 % (ref 11.5–15.5)
WBC: 7 10*3/uL (ref 4.0–10.5)
nRBC: 0.3 % — ABNORMAL HIGH (ref 0.0–0.2)

## 2020-02-12 LAB — GLUCOSE, CAPILLARY
Glucose-Capillary: 101 mg/dL — ABNORMAL HIGH (ref 70–99)
Glucose-Capillary: 100 mg/dL — ABNORMAL HIGH (ref 70–99)
Glucose-Capillary: 143 mg/dL — ABNORMAL HIGH (ref 70–99)
Glucose-Capillary: 103 mg/dL — ABNORMAL HIGH (ref 70–99)

## 2020-02-12 LAB — DIGOXIN LEVEL: Digoxin Level: 0.9 ng/mL — ABNORMAL LOW (ref 1.0–2.0)

## 2020-02-12 LAB — MAGNESIUM: Magnesium: 2.2 mg/dL (ref 1.7–2.4)

## 2020-02-12 LAB — HEPARIN LEVEL (UNFRACTIONATED): Heparin Unfractionated: 0.51 IU/mL (ref 0.30–0.70)

## 2020-02-12 SURGERY — ECHOCARDIOGRAM, TRANSESOPHAGEAL
Anesthesia: General

## 2020-02-12 MED ORDER — MIDAZOLAM HCL 5 MG/5ML IJ SOLN
INTRAMUSCULAR | Status: DC | PRN
  Administered 2020-02-12: 2 mg via INTRAVENOUS

## 2020-02-12 MED ORDER — LACTATED RINGERS IV SOLN
INTRAVENOUS | Status: DC
  Administered 2020-02-12: 1000 mL via INTRAVENOUS

## 2020-02-12 MED ORDER — FENTANYL CITRATE (PF) 100 MCG/2ML IJ SOLN
INTRAMUSCULAR | Status: DC | PRN
Start: 2020-02-12 — End: 2020-02-12
  Administered 2020-02-12 (×2): 50 ug via INTRAVENOUS

## 2020-02-12 MED ORDER — LIDOCAINE VISCOUS HCL 2 % MT SOLN
OROMUCOSAL | Status: AC
  Filled 2020-02-12: qty 30

## 2020-02-12 MED ORDER — ONDANSETRON HCL 4 MG/2ML IJ SOLN
INTRAMUSCULAR | Status: AC
  Filled 2020-02-12: qty 2

## 2020-02-12 MED ORDER — LIDOCAINE VISCOUS HCL 2 % MT SOLN
OROMUCOSAL | Status: DC | PRN
  Administered 2020-02-12: 1 via OROMUCOSAL

## 2020-02-12 MED ORDER — EPHEDRINE SULFATE-NACL 50-0.9 MG/10ML-% IV SOSY
PREFILLED_SYRINGE | INTRAVENOUS | Status: DC | PRN
  Administered 2020-02-12: 10 mg via INTRAVENOUS

## 2020-02-12 MED ORDER — GLYCOPYRROLATE 0.2 MG/ML IJ SOLN
INTRAMUSCULAR | Status: DC | PRN
  Administered 2020-02-12 (×2): .1 mg via INTRAVENOUS

## 2020-02-12 MED ORDER — AMIODARONE HCL 200 MG PO TABS
200.0000 mg | ORAL_TABLET | Freq: Every day | ORAL | Status: DC
  Administered 2020-02-12 – 2020-02-18 (×7): 200 mg via ORAL
  Filled 2020-02-12 (×7): qty 1

## 2020-02-12 MED ORDER — DEXMEDETOMIDINE (PRECEDEX) IN NS 20 MCG/5ML (4 MCG/ML) IV SYRINGE
PREFILLED_SYRINGE | INTRAVENOUS | Status: DC | PRN
  Administered 2020-02-12: 4 ug via INTRAVENOUS
  Administered 2020-02-12: 8 ug via INTRAVENOUS
  Administered 2020-02-12 (×4): 4 ug via INTRAVENOUS
  Administered 2020-02-12: 8 ug via INTRAVENOUS
  Administered 2020-02-12: 4 ug via INTRAVENOUS

## 2020-02-12 MED ORDER — ONDANSETRON HCL 4 MG/2ML IJ SOLN
4.0000 mg | Freq: Once | INTRAMUSCULAR | Status: AC
  Administered 2020-02-12: 4 mg via INTRAVENOUS

## 2020-02-12 MED ORDER — TORSEMIDE 20 MG PO TABS
40.0000 mg | ORAL_TABLET | Freq: Every day | ORAL | Status: DC
  Administered 2020-02-12 – 2020-02-15 (×4): 40 mg via ORAL
  Filled 2020-02-12 (×4): qty 2

## 2020-02-12 NOTE — Anesthesia Postprocedure Evaluation (Signed)
Anesthesia Post Note  Patient: Perry Cook  Procedure(s) Performed: TRANSESOPHAGEAL ECHOCARDIOGRAM (TEE) (N/A )     Patient location during evaluation: Endoscopy Anesthesia Type: General Level of consciousness: awake Pain management: pain level controlled Vital Signs Assessment: post-procedure vital signs reviewed and stable Respiratory status: spontaneous breathing Cardiovascular status: stable Postop Assessment: no apparent nausea or vomiting Anesthetic complications: no   No complications documented.  Last Vitals:  Vitals:   02/12/20 1435 02/12/20 1438  BP: (!) 81/47 (!) 90/38  Pulse: (!) 52 (!) 51  Resp: 10 12  Temp:    SpO2: 100% 100%    Last Pain:  Vitals:   02/12/20 1438  TempSrc:   PainSc: 0-No pain                 Caren Macadam

## 2020-02-12 NOTE — Transfer of Care (Signed)
Immediate Anesthesia Transfer of Care Note  Patient: Perry Cook  Procedure(s) Performed: TRANSESOPHAGEAL ECHOCARDIOGRAM (TEE) (N/A )  Patient Location: PACU  Anesthesia Type:MAC  Level of Consciousness: drowsy and patient cooperative  Airway & Oxygen Therapy: Patient Spontanous Breathing and Patient connected to nasal cannula oxygen  Post-op Assessment: Report given to RN and Post -op Vital signs reviewed and stable  Post vital signs: Reviewed and stable  Last Vitals:  Vitals Value Taken Time  BP 106/59 02/12/20 1332  Temp 36.5 C 02/12/20 1329  Pulse 55 02/12/20 1334  Resp 16 02/12/20 1334  SpO2 100 % 02/12/20 1334  Vitals shown include unvalidated device data.  Last Pain:  Vitals:   02/12/20 1329  TempSrc: Temporal  PainSc:       Patients Stated Pain Goal: 0 (91/50/56 9794)  Complications: No complications documented.

## 2020-02-12 NOTE — Progress Notes (Signed)
ANTICOAGULATION CONSULT NOTE  Pharmacy Consult for Heparin Indication: Atrial flutter with RVR  No Known Allergies  Patient Measurements: Height: 6' (182.9 cm) Weight: 61.1 kg (134 lb 11.2 oz) IBW/kg (Calculated) : 77.6 Heparin dosing weight: 70 kg  Vital Signs: Temp: 98.7 F (37.1 C) (09/20 0700) Temp Source: Oral (09/20 0700) BP: 99/68 (09/20 0341) Pulse Rate: 64 (09/20 0341)  Labs: Recent Labs    02/10/20 0432 02/10/20 0432 02/11/20 0458 02/11/20 0459 02/12/20 0338  HGB 11.5*   < > 11.8*  --  13.3  HCT 34.7*  --  35.0*  --  39.1  PLT 167  --  165  --  194  HEPARINUNFRC 0.42  --   --  0.50 0.51  CREATININE 1.01  --  1.02  --  1.14   < > = values in this interval not displayed.    Estimated Creatinine Clearance: 78.9 mL/min (by C-G formula based on SCr of 1.14 mg/dL).   Medical History: Past Medical History:  Diagnosis Date  . Congenital heart disease    Epstein anomaly s/p repair 2002    Assessment: 34 year old malewith a hx of Ebstein's anomaly syndrome s/p repair 2011. Patient with atrial flutter with RVR in the setting of cardiogenic shock. Pharmacy consulted to initiate heparin therapy. Patient was not on any anticoagulation PTA.  Heparin level therapeutic at 0.51 on drip rate 1650 units/hr. Nosebleeds have resolved with addition of afrin. CBC stable today. No other bleeding per pt, no infusion issues noted.  Goal of Therapy:  Heparin level 0.3-0.7 units/ml Monitor platelets by anticoagulation protocol: Yes   Plan:  Continue heparin infusion at 1650 units/hr Daily heparin level and CBC. Monitor nosebleeds.  Tama Headings, PharmD PGY2 Cardiology Pharmacy Resident Phone: [phone] 02/12/2020  9:05 AM  Please check [email or web address] for unit-specific pharmacy phone numbers.

## 2020-02-12 NOTE — CV Procedure (Addendum)
° ° °  TRANSESOPHAGEAL ECHOCARDIOGRAM   NAME:  Blanchard Willhite   MRN: [id number] DOB:  03-22-1986   ADMIT DATE: 02/06/2020  INDICATIONS:  Severe TR  PROCEDURE:   Informed consent was obtained prior to the procedure. The risks, benefits and alternatives for the procedure were discussed and the patient comprehended these risks.  Risks include, but are not limited to, cough, sore throat, vomiting, nausea, somnolence, esophageal and stomach trauma or perforation, bleeding, low blood pressure, aspiration, pneumonia, infection, trauma to the teeth and death.    After a procedural time-out, the patient was sedated by the anesthesia service. The transesophageal probe was inserted in the esophagus and stomach without difficulty and multiple views were obtained.    COMPLICATIONS:    There were no immediate complications.  FINDINGS:  LEFT VENTRICLE: EF = 35-40%. No regional wall motion abnormalities.  RIGHT VENTRICLE: Markedly dilated. Severely HK  LEFT ATRIUM: Normal   LEFT ATRIAL APPENDAGE: No thrombus.   RIGHT ATRIUM: Massively dilated  AORTIC VALVE:  Trileaflet. No AI/AS  MITRAL VALVE:    Normal. Mild to moderate MR  TRICUSPID VALVE: s/p previous repair. The septal leaflet appears restricted/sutured. The posterior leaflet appears to have redundant/partially chords without flail. The leaflets do not coapt. There is wide open TR  PULMONIC VALVE: Grossly normal. No PI  INTERATRIAL SEPTUM: No PFO or ASD.  PERICARDIUM: No effusion  DESCENDING AORTA: Mild plaque   Truman Hayward 1:38 PM

## 2020-02-12 NOTE — H&P (View-Only) (Signed)
Advanced Heart Failure Rounding Note  PCP-Cardiologist: Thurmon Fair, MD   Subjective:    [name]. Co-ox 64% today.   Excellent diuresis again yesterday, -4.7 L in UOP. Wt down an additional 5  Lb. Overall, has diuresed 22 lb. SCr and K normal.   CVP 15-16.    Denies dyspnea. No CP. No further nose bleeds. Only complaint is left upper arm pain at antecubital fossa, painful, harden vein.   LFTs continue to trend down.   QT interval remains prolonged on tele.   Very flat affect.   Objective:   Weight Range: 61.1 kg Body mass index is 18.27 kg/m.   Vital Signs:   Temp:  [97.6 F (36.4 C)-98.7 F (37.1 C)] 98.7 F (37.1 C) (09/20 0700) Pulse Rate:  [64-74] 64 (09/20 0341) Resp:  [9-17] 12 (09/20 0700) BP: (98-117)/(48-76) 99/68 (09/20 0341) SpO2:  [94 %-100 %] 95 % (09/20 0341) Weight:  [61.1 kg] 61.1 kg (09/20 0315)    Weight change: Filed Weights   02/10/20 0500 02/11/20 0545 02/12/20 0315  Weight: 65.5 kg 63.3 kg 61.1 kg    Intake/Output:   Intake/Output Summary (Last 24 hours) at 02/12/2020 0857 Last data filed at 02/12/2020 0743 Gross per 24 hour  Intake 1761.68 ml  Output 4640 ml  Net -2878.32 ml      Physical Exam   General:  Sitting up in bed, very flat affect. No resp difficulty HEENT: normal Neck: supple. + Rt IJ CVC. JVP elevated to ear. Carotids 2+ bilat; no bruits. No lymphadenopathy or thryomegaly appreciated. Cor: PMI nondisplaced. Regular rate & rhythm. 2/6 TR + RV lift Lungs: clear bilaterally, no wheezing  Abdomen: soft, nontender, nondistended. No hepatosplenomegaly. No bruits or masses. Good bowel sounds. Extremities: no cyanosis, clubbing, rash, no edema, + superficial thrombophlebitis left upper arm antecubital fossa  Neuro: alert & orientedx3, cranial nerves grossly intact. moves all 4 extremities w/o difficulty. Affect flat  Telemetry   NSR 60-70s Personally reviewed  Labs    CBC Recent Labs     02/11/20 0458 02/12/20 0338  WBC 7.5 7.0  HGB 11.8* 13.3  HCT 35.0* 39.1  MCV 84.1 84.8  PLT 165 194   Basic Metabolic Panel Recent Labs    42/59/56 0432 02/10/20 0432 02/11/20 0458 02/12/20 0338  NA 128*   < > 127* 126*  K 3.7   < > 3.4* 4.7  CL 89*   < > 87* 89*  CO2 31   < > 31 29  GLUCOSE 105*   < > 107* 100*  BUN 16   < > 14 20  CREATININE 1.01   < > 1.02 1.14  CALCIUM 8.0*   < > 8.1* 8.8*  MG 1.9  --   --   --    < > = values in this interval not displayed.   Liver Function Tests Recent Labs    02/11/20 0458 02/12/20 0338  AST 1,390* 918*  ALT 1,797* 1,581*  ALKPHOS 82 89  BILITOT 3.2* 2.3*  PROT 7.3 8.2*  ALBUMIN 3.1* 3.4*   No results for input(s): LIPASE, AMYLASE in the last 72 hours. Cardiac Enzymes No results for input(s): CKTOTAL, CKMB, CKMBINDEX, TROPONINI in the last 72 hours.  BNP: BNP (last 3 results) Recent Labs    02/07/20 1450  BNP 862.2*    ProBNP (last 3 results) No results for input(s): PROBNP in the last 8760 hours.   D-Dimer No results for input(s): DDIMER  in the last 72 hours. Hemoglobin A1C No results for input(s): HGBA1C in the last 72 hours. Fasting Lipid Panel No results for input(s): CHOL, HDL, LDLCALC, TRIG, CHOLHDL, LDLDIRECT in the last 72 hours. Thyroid Function Tests No results for input(s): TSH, T4TOTAL, T3FREE, THYROIDAB in the last 72 hours.  Invalid input(s): FREET3  Other results:   Imaging    No results found.   Medications:     Scheduled Medications:  Chlorhexidine Gluconate Cloth  6 each Topical Daily   digoxin  0.125 mg Oral Daily   feeding supplement (ENSURE ENLIVE)  237 mL Oral BID BM   multivitamin with minerals  1 tablet Oral Daily   nicotine  14 mg Transdermal Daily   oxymetazoline  1 spray Each Nare BID   spironolactone  25 mg Oral Daily    Infusions:  amiodarone 30 mg/hr (02/12/20 0800)   cefTRIAXone (ROCEPHIN)  IV Stopped (02/11/20 1231)   dextrose     heparin  1,650 Units/hr (02/12/20 0800)   milrinone 0.25 mcg/kg/min (02/12/20 0800)    PRN Medications: alum & mag hydroxide-simeth, guaiFENesin-dextromethorphan, morphine injection, white petrolatum    Patient Profile   [name] a 34 year old malewith a hx of Ebstein's anomaly syndrome s/p repair 2011 whom we are asked to see by Dr. Addison Lank for cardiogenic shock  He has a hx ofEbstein's anomaly syndromewith repair at age 52yo.He reports he was initially diagnosed after collapsing during football practice.He states he was previously treated for unknown arrhythmia in the postsurgical setting with digoxin and beta-blocker. He was only on medication for short period of time.He has not followed with cardiology care in many years.   Assessment/Plan   1. 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 Ebstein anomaly s/p repair with severe TR and to-fro flow in hepatic veins - baseline RV function and degree of TR unclear suspect decompensation related to AFL  - admitted w/ cardiogenic shock, initial co-ox 45% - Started on milrinone, 0.25. CO-OX down to 45% on 9/17 and milrinone increased to 0.375. Dose reduced back down yesterday to 0.25. Co-ox 64% this am.  -Excellent diuresis w/ IV Lasix, overall down 22 lb.   - CVP 15-16, which is about as good as we may get w/ RV failure. No peripheral edema on exam  - stop IV Lasix. Start PO diuretics, torsemide 40 mg daily  - Continue spiro 25 - continue digoxin 0.125 mg (dig level ok) - BP too soft for Entresto  - TEE scheduled today   2. Atrial flutter with RVR - converted to NSR on IV amio. Rates well controlled  - stop amio gtt and change to PO 200 mg daily  - continue IV heparin  - Dr. Addison Lank has discussed with EP and seems to be 1:1 RA circuit. Can consider eventual ablation  3. AKI - due to  shock - resolved  4. Shock liver - due to cardiogenic shock. hepatitis panels negative - improving w/ inotropic support   5. Hyperkalemia/ Hypokalemia - improved Keep K > 4.0  6. 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 ID:  - PCT elevated at 9.0 - On CTX for possible cholecystitis though I suspect GB issues due to severe RV failure - No AF. PCT coming down.  - Would give 7 days total  8. Prolonged QT -  will stop amio gtt and change to PO 200 mg daily  - avoid all QT prolonging meds - monitor closely  - d/w PharmD and MAR adjusted.   9. Depression - says he has tried anti-depressants before and they don't help. Currently not a candidate for one with QT prolongation as well.   10. LUE Superficial Thrombophlebitis - supportive care w/ heating pads   Length of Stay: 6  Brittainy Simmons, PA-C  02/12/2020, 8:57 AM  Advanced Heart Failure Team Pager [phone] (M-F; 7a - 4p)  Please contact [facility] for night-coverage after hours (4p -7a ) and weekends on [email or web address]   Patient seen and examined with the above-signed Advanced Practice Provider and/or Housestaff. I personally reviewed laboratory data, imaging studies and relevant notes. I independently examined the patient and formulated the important aspects of the plan. I have edited the note to reflect any of my changes or salient points. I have personally discussed the plan with the patient and/or family.  Feels ok. Denies CP or SOB. Remains I NSR on IV amio. QT > on ECG. Amio gtt cut back to 30 yesterday. Continues to diurese on IV lasix. Weight down 25 pounds.   CVP 15  General:  Sitting up in bed No resp difficulty HEENT: normal Neck: supple. JVP to jaw  Carotids 2+ bilat; no bruits. No lymphadenopathy or thryomegaly appreciated. Cor: PMI nondisplaced. Regular rate & rhythm. 2/6 TR Lungs: clear Abdomen: soft, nontender, nondistended. No  hepatosplenomegaly. No bruits or masses. Good bowel sounds. Extremities: no cyanosis, clubbing, rash, edema Neuro: alert & orientedx3, cranial nerves grossly intact. moves all 4 extremities w/o difficulty. Affect pleasant  Suspect we have diuresed him as much as we can. Will change to po diuretics. Maintaining NSR on IV amio. Given severe QT prolongation with change amio to po. TEE this afternoon to further evaluate TV repair.   Arvilla Meres, MD  10:57 AM

## 2020-02-12 NOTE — Anesthesia Preprocedure Evaluation (Signed)
Anesthesia Evaluation  Patient identified by MRN, date of birth, ID band Patient awake    Reviewed: Allergy & Precautions, NPO status , Patient's Chart, lab work & pertinent test results  Airway Mallampati: I       Dental no notable dental hx. (+) Teeth Intact   Pulmonary Current Smoker and Patient abstained from smoking.,    Pulmonary exam normal        Cardiovascular + Valvular Problems/Murmurs  Rhythm:Regular Rate:Normal + Systolic murmurs    Neuro/Psych negative neurological ROS  negative psych ROS   GI/Hepatic negative GI ROS, Neg liver ROS,   Endo/Other  negative endocrine ROS  Renal/GU negative Renal ROS     Musculoskeletal negative musculoskeletal ROS (+)   Abdominal Normal abdominal exam  (+)   Peds  Hematology negative hematology ROS (+)   Anesthesia Other Findings   Reproductive/Obstetrics                             Anesthesia Physical Anesthesia Plan  ASA: III  Anesthesia Plan: General   Post-op Pain Management:    Induction: Intravenous  PONV Risk Score and Plan: TIVA  Airway Management Planned: Natural Airway and Mask  Additional Equipment: TEE  Intra-op Plan:   Post-operative Plan:   Informed Consent: I have reviewed the patients History and Physical, chart, labs and discussed the procedure including the risks, benefits and alternatives for the proposed anesthesia with the patient or authorized representative who has indicated his/her understanding and acceptance.     Dental advisory given  Plan Discussed with: CRNA  Anesthesia Plan Comments:         Anesthesia Quick Evaluation

## 2020-02-12 NOTE — Progress Notes (Addendum)
Advanced Heart Failure Rounding Note  PCP-Cardiologist: Thurmon Fair, MD   Subjective:    Perry Cook reduced yesterday to 0.25. Co-ox 64% today.   Excellent diuresis again yesterday, -4.7 L in UOP. Wt down an additional 5  Lb. Overall, has diuresed 22 lb. SCr and K normal.   CVP 15-16.    Denies dyspnea. No CP. No further nose bleeds. Only complaint is left upper arm pain at antecubital fossa, painful, harden vein.   LFTs continue to trend down.   QT interval remains prolonged on tele.   Very flat affect.   Objective:   Weight Range: 61.1 kg Body mass index is 18.27 kg/m.   Vital Signs:   Temp:  [97.6 F (36.4 C)-98.7 F (37.1 C)] 98.7 F (37.1 C) (09/20 0700) Pulse Rate:  [64-74] 64 (09/20 0341) Resp:  [9-17] 12 (09/20 0700) BP: (98-117)/(48-76) 99/68 (09/20 0341) SpO2:  [94 %-100 %] 95 % (09/20 0341) Weight:  [61.1 kg] 61.1 kg (09/20 0315)    Weight change: Filed Weights   02/10/20 0500 02/11/20 0545 02/12/20 0315  Weight: 65.5 kg 63.3 kg 61.1 kg    Intake/Output:   Intake/Output Summary (Last 24 hours) at 02/12/2020 0857 Last data filed at 02/12/2020 0743 Gross per 24 hour  Intake 1761.68 ml  Output 4640 ml  Net -2878.32 ml      Physical Exam   General:  Sitting up in bed, very flat affect. No resp difficulty HEENT: normal Neck: supple. + Rt IJ CVC. JVP elevated to ear. Carotids 2+ bilat; no bruits. No lymphadenopathy or thryomegaly appreciated. Cor: PMI nondisplaced. Regular rate & rhythm. 2/6 TR + RV lift Lungs: clear bilaterally, no wheezing  Abdomen: soft, nontender, nondistended. No hepatosplenomegaly. No bruits or masses. Good bowel sounds. Extremities: no cyanosis, clubbing, rash, no edema, + superficial thrombophlebitis left upper arm antecubital fossa  Neuro: alert & orientedx3, cranial nerves grossly intact. moves all 4 extremities w/o difficulty. Affect flat  Telemetry   NSR 60-70s Personally reviewed  Labs    CBC Recent Labs     02/11/20 0458 02/12/20 0338  WBC 7.5 7.0  HGB 11.8* 13.3  HCT 35.0* 39.1  MCV 84.1 84.8  PLT 165 194   Basic Metabolic Panel Recent Labs    42/59/56 0432 02/10/20 0432 02/11/20 0458 02/12/20 0338  NA 128*   < > 127* 126*  K 3.7   < > 3.4* 4.7  CL 89*   < > 87* 89*  CO2 31   < > 31 29  GLUCOSE 105*   < > 107* 100*  BUN 16   < > 14 20  CREATININE 1.01   < > 1.02 1.14  CALCIUM 8.0*   < > 8.1* 8.8*  MG 1.9  --   --   --    < > = values in this interval not displayed.   Liver Function Tests Recent Labs    02/11/20 0458 02/12/20 0338  AST 1,390* 918*  ALT 1,797* 1,581*  ALKPHOS 82 89  BILITOT 3.2* 2.3*  PROT 7.3 8.2*  ALBUMIN 3.1* 3.4*   No results for input(s): LIPASE, AMYLASE in the last 72 hours. Cardiac Enzymes No results for input(s): CKTOTAL, CKMB, CKMBINDEX, TROPONINI in the last 72 hours.  BNP: BNP (last 3 results) Recent Labs    02/07/20 1450  BNP 862.2*    ProBNP (last 3 results) No results for input(s): PROBNP in the last 8760 hours.   D-Dimer No results for input(s): DDIMER  in the last 72 hours. Hemoglobin A1C No results for input(s): HGBA1C in the last 72 hours. Fasting Lipid Panel No results for input(s): CHOL, HDL, LDLCALC, TRIG, CHOLHDL, LDLDIRECT in the last 72 hours. Thyroid Function Tests No results for input(s): TSH, T4TOTAL, T3FREE, THYROIDAB in the last 72 hours.  Invalid input(s): FREET3  Other results:   Imaging    No results found.   Medications:     Scheduled Medications:  Chlorhexidine Gluconate Cloth  6 each Topical Daily   digoxin  0.125 mg Oral Daily   feeding supplement (ENSURE ENLIVE)  237 mL Oral BID BM   multivitamin with minerals  1 tablet Oral Daily   nicotine  14 mg Transdermal Daily   oxymetazoline  1 spray Each Nare BID   spironolactone  25 mg Oral Daily    Infusions:  amiodarone 30 mg/hr (02/12/20 0800)   cefTRIAXone (ROCEPHIN)  IV Stopped (02/11/20 1231)   dextrose     heparin  1,650 Units/hr (02/12/20 0800)   Perry Cook 0.25 mcg/kg/min (02/12/20 0800)    PRN Medications: alum & mag hydroxide-simeth, guaiFENesin-dextromethorphan, morphine injection, white petrolatum    Patient Profile   [name] a 34 year old malewith a hx of Perry Cook's anomaly syndrome s/p repair 2011 whom we are asked to see by Dr. Addison Lank for cardiogenic shock  He has a hx ofEbstein's anomaly syndromewith repair at age 34yo.He reports he was initially diagnosed after collapsing during football practice.He states he was previously treated for unknown arrhythmia in the postsurgical setting with digoxin and beta-blocker. He was only on medication for short period of time.He has not followed with cardiology care in many years.   Assessment/Plan   1. 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 Perry Cook anomaly s/p repair with severe TR and to-fro flow in hepatic veins - baseline RV function and degree of TR unclear suspect decompensation related to AFL  - admitted w/ cardiogenic shock, initial co-ox 45% - Started on Perry Cook, 0.25. CO-OX down to 45% on 9/17 and Perry Cook increased to 0.375. Dose reduced back down yesterday to 0.25. Co-ox 64% this am.  -Excellent diuresis w/ IV Lasix, overall down 22 lb.   - CVP 15-16, which is about as good as we may get w/ RV failure. No peripheral edema on exam  - stop IV Lasix. Start PO diuretics, torsemide 40 mg daily  - Continue spiro 25 - continue digoxin 0.125 mg (dig level ok) - BP too soft for Entresto  - TEE scheduled today   2. Atrial flutter with RVR - converted to NSR on IV amio. Rates well controlled  - stop amio gtt and change to PO 200 mg daily  - continue IV heparin  - Dr. Addison Lank has discussed with EP and seems to be 1:1 RA circuit. Can consider eventual ablation  3. AKI - due to  shock - resolved  4. Shock liver - due to cardiogenic shock. hepatitis panels negative - improving w/ inotropic support   5. Hyperkalemia/ Hypokalemia - improved Keep K > 4.0  6. Severe TR - Perry Cook anomaly s/p repair with severe TR - plan TEE today to better assess   7. ID:  - PCT elevated at 9.0 - On CTX for possible cholecystitis though I suspect GB issues due to severe RV failure - No AF. PCT coming down.  - Would give 7 days total  8. Prolonged QT -  will stop amio gtt and change to PO 200 mg daily  - avoid all QT prolonging meds - monitor closely  - d/w PharmD and MAR adjusted.   9. Depression - says he has tried anti-depressants before and they don't help. Currently not a candidate for one with QT prolongation as well.   10. LUE Superficial Thrombophlebitis - supportive care w/ heating pads   Length of Stay: 6  Brittainy Simmons, PA-C  02/12/2020, 8:57 AM  Advanced Heart Failure Team Pager [phone] (M-F; 7a - 4p)  Please contact [facility] for night-coverage after hours (4p -7a ) and weekends on [email or web address]   Patient seen and examined with the above-signed Advanced Practice Provider and/or Housestaff. I personally reviewed laboratory data, imaging studies and relevant notes. I independently examined the patient and formulated the important aspects of the plan. I have edited the note to reflect any of my changes or salient points. I have personally discussed the plan with the patient and/or family.  Feels ok. Denies CP or SOB. Remains I NSR on IV amio. QT > on ECG. Amio gtt cut back to 30 yesterday. Continues to diurese on IV lasix. Weight down 25 pounds.   CVP 15  General:  Sitting up in bed No resp difficulty HEENT: normal Neck: supple. JVP to jaw  Carotids 2+ bilat; no bruits. No lymphadenopathy or thryomegaly appreciated. Cor: PMI nondisplaced. Regular rate & rhythm. 2/6 TR Lungs: clear Abdomen: soft, nontender, nondistended. No  hepatosplenomegaly. No bruits or masses. Good bowel sounds. Extremities: no cyanosis, clubbing, rash, edema Neuro: alert & orientedx3, cranial nerves grossly intact. moves all 4 extremities w/o difficulty. Affect pleasant  Suspect we have diuresed him as much as we can. Will change to po diuretics. Maintaining NSR on IV amio. Given severe QT prolongation with change amio to po. TEE this afternoon to further evaluate TV repair.   Arvilla Meres, MD  10:57 AM

## 2020-02-12 NOTE — Interval H&P Note (Signed)
History and Physical Interval Note:  02/12/2020 12:33 PM  Perry Cook  has presented today for surgery, with the diagnosis of  severe TR. H/o Ebstein's anomaly. The various methods of treatment have been discussed with the patient and family. After consideration of risks, benefits and other options for treatment, the patient has consented to  Transesophageal Echocardiogram as a surgical intervention.  The patient's history has been reviewed, patient examined, no change in status, stable for surgery.  I have reviewed the patient's chart and labs.  Questions were answered to the patient's satisfaction.     Uel Davidow

## 2020-02-12 NOTE — Progress Notes (Signed)
Echocardiogram Echocardiogram Transesophageal has been performed.  Perry Cook 02/12/2020, 1:28 PM

## 2020-02-13 LAB — COMPREHENSIVE METABOLIC PANEL
ALT: 1187 U/L — ABNORMAL HIGH (ref 0–44)
AST: 530 U/L — ABNORMAL HIGH (ref 15–41)
Albumin: 3.3 g/dL — ABNORMAL LOW (ref 3.5–5.0)
Alkaline Phosphatase: 80 U/L (ref 38–126)
Anion gap: 9 (ref 5–15)
BUN: 22 mg/dL — ABNORMAL HIGH (ref 6–20)
CO2: 28 mmol/L (ref 22–32)
Calcium: 9 mg/dL (ref 8.9–10.3)
Chloride: 88 mmol/L — ABNORMAL LOW (ref 98–111)
Creatinine, Ser: 1.19 mg/dL (ref 0.61–1.24)
GFR calc Af Amer: >60 mL/min (ref >60)
GFR calc non Af Amer: >60 mL/min (ref >60)
Glucose, Bld: 91 mg/dL (ref 70–99)
Potassium: 4.3 mmol/L (ref 3.5–5.1)
Sodium: 125 mmol/L — ABNORMAL LOW (ref 135–145)
Total Bilirubin: 2.1 mg/dL — ABNORMAL HIGH (ref 0.3–1.2)
Total Protein: 8 g/dL (ref 6.5–8.1)

## 2020-02-13 LAB — GLUCOSE, CAPILLARY
Glucose-Capillary: 86 mg/dL (ref 70–99)
Glucose-Capillary: 86 mg/dL (ref 70–99)
Glucose-Capillary: 86 mg/dL (ref 70–99)
Glucose-Capillary: 98 mg/dL (ref 70–99)
Glucose-Capillary: 142 mg/dL — ABNORMAL HIGH (ref 70–99)

## 2020-02-13 LAB — COOXEMETRY PANEL
Carboxyhemoglobin: 1.9 % — ABNORMAL HIGH (ref 0.5–1.5)
Carboxyhemoglobin: 1.2 % (ref 0.5–1.5)
Methemoglobin: 1.1 % (ref 0.0–1.5)
Methemoglobin: 0.5 % (ref 0.0–1.5)
O2 Saturation: 71.7 %
O2 Saturation: 67.2 %
Total hemoglobin: 14 g/dL (ref 12.0–16.0)
Total hemoglobin: 15.5 g/dL (ref 12.0–16.0)

## 2020-02-13 LAB — HEPARIN LEVEL (UNFRACTIONATED): Heparin Unfractionated: 0.66 IU/mL (ref 0.30–0.70)

## 2020-02-13 MED ORDER — MILRINONE LACTATE IN DEXTROSE 20-5 MG/100ML-% IV SOLN: 0.1250 ug/kg/min | INTRAVENOUS | Status: DC

## 2020-02-13 NOTE — Plan of Care (Signed)

## 2020-02-13 NOTE — Progress Notes (Signed)
Advanced Heart Failure Rounding Note  PCP-Cardiologist: Thurmon Fair, MD   Subjective:    Remains on milrinone 0.25. Co-ox 72%  Off lasix gtt. Remains in NSR on po amio.  No CP or SOB.  TEE yesterday LVEF 35-40% Severe RV dysfunction wide-open TR  Objective:   Weight Range: 60.4 kg Body mass index is 18.06 kg/m.   Vital Signs:   Temp:  [97.5 F (36.4 C)-98.7 F (37.1 C)] 98.4 F (36.9 C) (09/21 0330) Pulse Rate:  [51-61] 61 (09/21 0330) Resp:  [8-18] 12 (09/20 1800) BP: (80-123)/(29-69) 103/63 (09/20 1933) SpO2:  [95 %-100 %] 98 % (09/20 1800) Weight:  [60.4 kg] 60.4 kg (09/21 0330)    Weight change: Filed Weights   02/11/20 0545 02/12/20 0315 02/13/20 0330  Weight: 63.3 kg 61.1 kg 60.4 kg    Intake/Output:   Intake/Output Summary (Last 24 hours) at 02/13/2020 0613 Last data filed at 02/13/2020 0342 Gross per 24 hour  Intake 958.77 ml  Output 2450 ml  Net -1491.23 ml      Physical Exam   General:  Sitting up in bed No resp difficulty HEENT: normal Neck: supple. JVP to jaw. Carotids 2+ bilat; no bruits. No lymphadenopathy or thryomegaly appreciated. Cor: PMI nondisplaced. Regular rate & rhythm. 2/6 TR Lungs: clear Abdomen: soft, nontender, nondistended. No hepatosplenomegaly. No bruits or masses. Good bowel sounds. Extremities: no cyanosis, clubbing, rash, edema Neuro: alert & orientedx3, cranial nerves grossly intact. moves all 4 extremities w/o difficulty. Affect pleasant  Telemetry   NSR 60s Personally reviewed  Labs    CBC Recent Labs    02/11/20 0458 02/12/20 0338  WBC 7.5 7.0  HGB 11.8* 13.3  HCT 35.0* 39.1  MCV 84.1 84.8  PLT 165 194   Basic Metabolic Panel Recent Labs    44/03/47 0458 02/12/20 0338 02/12/20 1051  NA 127* 126*  --   K 3.4* 4.7  --   CL 87* 89*  --   CO2 31 29  --   GLUCOSE 107* 100*  --   BUN 14 20  --   CREATININE 1.02 1.14  --   CALCIUM 8.1* 8.8*  --   MG  --   --  2.2   Liver Function  Tests Recent Labs    02/11/20 0458 02/12/20 0338  AST 1,390* 918*  ALT 1,797* 1,581*  ALKPHOS 82 89  BILITOT 3.2* 2.3*  PROT 7.3 8.2*  ALBUMIN 3.1* 3.4*   No results for input(s): LIPASE, AMYLASE in the last 72 hours. Cardiac Enzymes No results for input(s): CKTOTAL, CKMB, CKMBINDEX, TROPONINI in the last 72 hours.  BNP: BNP (last 3 results) Recent Labs    02/07/20 1450  BNP 862.2*    ProBNP (last 3 results) No results for input(s): PROBNP in the last 8760 hours.   D-Dimer No results for input(s): DDIMER in the last 72 hours. Hemoglobin A1C No results for input(s): HGBA1C in the last 72 hours. Fasting Lipid Panel No results for input(s): CHOL, HDL, LDLCALC, TRIG, CHOLHDL, LDLDIRECT in the last 72 hours. Thyroid Function Tests No results for input(s): TSH, T4TOTAL, T3FREE, THYROIDAB in the last 72 hours.  Invalid input(s): FREET3  Other results:   Imaging    No results found.   Medications:     Scheduled Medications: . amiodarone  200 mg Oral Daily  . Chlorhexidine Gluconate Cloth  6 each Topical Daily  . digoxin  0.125 mg Oral Daily  . feeding supplement (ENSURE ENLIVE)  237  mL Oral BID BM  . multivitamin with minerals  1 tablet Oral Daily  . nicotine  14 mg Transdermal Daily  . oxymetazoline  1 spray Each Nare BID  . spironolactone  25 mg Oral Daily  . torsemide  40 mg Oral Daily    Infusions: . cefTRIAXone (ROCEPHIN)  IV Stopped (02/12/20 1541)  . dextrose    . heparin 1,650 Units/hr (02/12/20 2344)  . milrinone 0.25 mcg/kg/min (02/13/20 0328)    PRN Medications: alum & mag hydroxide-simeth, guaiFENesin-dextromethorphan, morphine injection, white petrolatum    Patient Profile   [name] a 34 year old malewith a hx of Ebstein's anomaly syndrome s/p repair 2011 whom we are asked to see by Dr. Addison Cook for cardiogenic shock  He has a hx ofEbstein's anomaly syndromewith repair at age 76yo.He reports he was initially  diagnosed after collapsing during football practice.He states he was previously treated for unknown arrhythmia in the postsurgical setting with digoxin and beta-blocker. He was only on medication for short period of time.He has not followed with cardiology care in many years.   Assessment/Plan   1. 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 Ebstein anomaly s/p repair with severe TR and to-fro flow in hepatic veins - baseline RV function and degree of TR unclear suspect decompensation related to AFL  - admitted w/ cardiogenic shock, initial co-ox 45% - Started on milrinone, 0.25. CO-OX down to 45% on 9/17 and milrinone increased to 0.375.  - On milrinone 0.25 co-ox 72%  Decrease to 0.125 - Volume status looks good. 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  - Continue spiro 25 - continue digoxin 0.125 mg (dig level ok) - BP too soft for Entresto  - Need to review TEE with Dr. Cornelius Cook to discuss options for surgical repair  2. Atrial flutter with RVR - converted to NSR on IV amio. Rates well controlled  - on amio 200 daily. Watch QT - continue IV heparin. Switch to Eliquis when surgical plan decided - Dr. Addison Cook has discussed with EP and seems to be 1:1 RA circuit. Can consider eventual ablation  3. AKI - due to shock - resolved  4. Shock liver - due to cardiogenic shock. hepatitis panels negative - improving w/ inotropic support   5. Hyperkalemia/ Hypokalemia - improved Keep K > 4.0  6. 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 ID:  - PCT elevated at 9.0 - On CTX for possible cholecystitis though I suspect GB issues due to severe RV failure - No AF. PCT coming down.  - Would give 7 days total  8. Prolonged QT - now on po  amio - monitor closely  - d/w PharmD and MAR adjusted.   9. Depression - says he has tried anti-depressants before and they don't help. Currently not a candidate for one with QT prolongation as well.   10. LUE Superficial Thrombophlebitis - supportive care w/ heating pads  - improved  Length of Stay: 7  Perry Meres, MD  02/13/2020, 6:13 AM  Advanced Heart Failure Team Pager [phone] (M-F; 7a - 4p)  Please contact [facility] for night-coverage after hours (4p -7a ) and weekends on [email or web address]

## 2020-02-13 NOTE — Progress Notes (Signed)
ANTICOAGULATION CONSULT NOTE  Pharmacy Consult for Heparin Indication: Atrial flutter with RVR  No Known Allergies  Patient Measurements: Height: 6' (182.9 cm) Weight: 60.4 kg (133 lb 2.5 oz) IBW/kg (Calculated) : 77.6 Heparin dosing weight: 70 kg  Vital Signs: Temp: 98 F (36.7 C) (09/21 0800) Temp Source: Oral (09/21 0800) BP: 107/55 (09/21 0800) Pulse Rate: 67 (09/21 0800)  Labs: Recent Labs    02/11/20 0458 02/11/20 0459 02/12/20 0338 02/13/20 0500  HGB 11.8*  --  13.3  --   HCT 35.0*  --  39.1  --   PLT 165  --  194  --   HEPARINUNFRC  --  0.50 0.51 0.66  CREATININE 1.02  --  1.14 1.19    Estimated Creatinine Clearance: 74.7 mL/min (by C-G formula based on SCr of 1.19 mg/dL).   Medical History: Past Medical History:  Diagnosis Date  . Congenital heart disease    Epstein anomaly s/p repair 2002    Assessment: 34 year old malewith a hx of Ebstein's anomaly syndrome s/p repair 2011. Patient with atrial flutter with RVR in the setting of cardiogenic shock. Pharmacy consulted to initiate heparin therapy. Patient was not on any anticoagulation PTA.  Heparin level therapeutic but trending up at 0.66 on drip rate 1650 units/hr. Pt states he had a small nosebleed this morning but resolved quickly and was not as severe as it was over the weekend. CBC stable on last check. No other bleeding per pt, no infusion issues noted.  Goal of Therapy:  Heparin level 0.3-0.7 units/ml Monitor platelets by anticoagulation protocol: Yes   Plan:  Decrease heparin infusion to 1600 units/hr Daily heparin level and CBC. Monitor nosebleeds.  Tama Headings, PharmD PGY2 Cardiology Pharmacy Resident Phone: [phone] 02/13/2020  9:05 AM  Please check [email or web address] for unit-specific pharmacy phone numbers.

## 2020-02-14 ENCOUNTER — Encounter (HOSPITAL_COMMUNITY): Payer: Self-pay | Admitting: Internal Medicine

## 2020-02-14 LAB — CBC
HCT: 42.7 % (ref 39.0–52.0)
Hemoglobin: 14.6 g/dL (ref 13.0–17.0)
MCH: 28.9 pg (ref 26.0–34.0)
MCHC: 34.2 g/dL (ref 30.0–36.0)
MCV: 84.4 fL (ref 80.0–100.0)
Platelets: 218 10*3/uL (ref 150–400)
RBC: 5.06 MIL/uL (ref 4.22–5.81)
RDW: 15.2 % (ref 11.5–15.5)
WBC: 6.1 10*3/uL (ref 4.0–10.5)
nRBC: 0 % (ref 0.0–0.2)

## 2020-02-14 LAB — COOXEMETRY PANEL
Carboxyhemoglobin: 1.3 % (ref 0.5–1.5)
Carboxyhemoglobin: 1.7 % — ABNORMAL HIGH (ref 0.5–1.5)
Methemoglobin: 0.6 % (ref 0.0–1.5)
Methemoglobin: 0.9 % (ref 0.0–1.5)
O2 Saturation: 69.9 %
O2 Saturation: 69.1 %
Total hemoglobin: 14.8 g/dL (ref 12.0–16.0)
Total hemoglobin: 15.9 g/dL (ref 12.0–16.0)

## 2020-02-14 LAB — TYPE AND SCREEN
ABO/RH(D): A POS
Antibody Screen: NEGATIVE

## 2020-02-14 LAB — COMPREHENSIVE METABOLIC PANEL
ALT: 913 U/L — ABNORMAL HIGH (ref 0–44)
AST: 328 U/L — ABNORMAL HIGH (ref 15–41)
Albumin: 3.3 g/dL — ABNORMAL LOW (ref 3.5–5.0)
Alkaline Phosphatase: 80 U/L (ref 38–126)
Anion gap: 10 (ref 5–15)
BUN: 23 mg/dL — ABNORMAL HIGH (ref 6–20)
CO2: 28 mmol/L (ref 22–32)
Calcium: 9.2 mg/dL (ref 8.9–10.3)
Chloride: 89 mmol/L — ABNORMAL LOW (ref 98–111)
Creatinine, Ser: 1.1 mg/dL (ref 0.61–1.24)
GFR calc Af Amer: >60 mL/min (ref >60)
GFR calc non Af Amer: >60 mL/min (ref >60)
Glucose, Bld: 114 mg/dL — ABNORMAL HIGH (ref 70–99)
Potassium: 4.1 mmol/L (ref 3.5–5.1)
Sodium: 127 mmol/L — ABNORMAL LOW (ref 135–145)
Total Bilirubin: 1.4 mg/dL — ABNORMAL HIGH (ref 0.3–1.2)
Total Protein: 7.9 g/dL (ref 6.5–8.1)

## 2020-02-14 LAB — HEPARIN LEVEL (UNFRACTIONATED): Heparin Unfractionated: 0.99 IU/mL — ABNORMAL HIGH (ref 0.30–0.70)

## 2020-02-14 MED ORDER — LOSARTAN POTASSIUM 25 MG PO TABS
12.5000 mg | ORAL_TABLET | Freq: Every day | ORAL | Status: DC
  Administered 2020-02-14: 12.5 mg via ORAL
  Filled 2020-02-14: qty 1

## 2020-02-14 MED ORDER — MELATONIN 3 MG PO TABS
3.0000 mg | ORAL_TABLET | Freq: Every day | ORAL | Status: DC
  Administered 2020-02-14 – 2020-02-17 (×4): 3 mg via ORAL
  Filled 2020-02-14 (×4): qty 1

## 2020-02-14 MED ORDER — APIXABAN 5 MG PO TABS
5.0000 mg | ORAL_TABLET | Freq: Two times a day (BID) | ORAL | Status: DC
  Administered 2020-02-14 – 2020-02-18 (×9): 5 mg via ORAL
  Filled 2020-02-14 (×9): qty 1

## 2020-02-14 NOTE — Progress Notes (Signed)
ANTICOAGULATION CONSULT NOTE - Follow Up Consult  Pharmacy Consult for heparin Indication: Aflutter  Labs: Recent Labs    02/12/20 0338 02/13/20 0500 02/14/20 0500  HGB 13.3  --  14.6  HCT 39.1  --  42.7  PLT 194  --  218  HEPARINUNFRC 0.51 0.66 0.99*  CREATININE 1.14 1.19  --     Assessment: 34yo male supratherapeutic on heparin with higher level despite rate decrease; no gtt issues or signs of bleeding per RN.  Goal of Therapy:  Heparin level 0.3-0.7 units/ml   Plan:  Will decrease heparin gtt by 2-3 units/kg/hr to 1450 units/hr and check level in 6 hours.    Vernard Gambles, PharmD, BCPS  02/14/2020,5:36 AM

## 2020-02-14 NOTE — Progress Notes (Addendum)
Advanced Heart Failure Rounding Note  PCP-Cardiologist: Perry Fair, MD   Subjective:    Milrinone reduced yesterday to 0.125. Co-ox good today at 70%.   On PO diuretics. CVP 14 but wt stable, down 3 lb from yesterday.  Remains in NSR.   Only complaint today is poor sleep    Objective:   Weight Range: 59.1 kg Body mass index is 17.67 kg/m.   Vital Signs:   Temp:  [98.4 F (36.9 C)-98.6 F (37 C)] 98.5 F (36.9 C) (09/22 0409) Pulse Rate:  [60-71] 71 (09/22 0800) Resp:  [15-20] 16 (09/22 0800) BP: (103-119)/(51-68) 119/66 (09/22 0800) SpO2:  [95 %-97 %] 96 % (09/22 0800) Weight:  [59.1 kg] 59.1 kg (09/22 0409) Last BM Date: 02/12/20 (per patient)  Weight change: Filed Weights   02/12/20 0315 02/13/20 0330 02/14/20 0409  Weight: 61.1 kg 60.4 kg 59.1 kg    Intake/Output:   Intake/Output Summary (Last 24 hours) at 02/14/2020 0844 Last data filed at 02/14/2020 0800 Gross per 24 hour  Intake 1194.48 ml  Output 1600 ml  Net -405.52 ml      Physical Exam   CVP 14 General: young male laying in bed. No resp difficulty HEENT: normal Neck: supple. JVP to ear. Carotids 2+ bilat; no bruits. No lymphadenopathy or thryomegaly appreciated. Cor: PMI nondisplaced. Regular rate & rhythm. +2/6 TR Lungs: clear. No wheezing  Abdomen: soft, nontender, nondistended. No hepatosplenomegaly. No bruits or masses. Good bowel sounds. Extremities: no cyanosis, clubbing, rash, edema Neuro: alert & orientedx3, cranial nerves grossly intact. moves all 4 extremities w/o difficulty. Affect pleasant  Telemetry   NSR 60s Personally reviewed  Labs    CBC Recent Labs    02/12/20 0338 02/14/20 0500  WBC 7.0 6.1  HGB 13.3 14.6  HCT 39.1 42.7  MCV 84.8 84.4  PLT 194 218   Basic Metabolic Panel Recent Labs    37/90/24 0338 02/12/20 1051 02/13/20 0500 02/14/20 0500  NA   < >  --  125* 127*  K   < >  --  4.3 4.1  CL   < >  --  88* 89*  CO2   < >  --  28 28  GLUCOSE    < >  --  91 114*  BUN   < >  --  22* 23*  CREATININE   < >  --  1.19 1.10  CALCIUM   < >  --  9.0 9.2  MG  --  2.2  --   --    < > = values in this interval not displayed.   Liver Function Tests Recent Labs    02/13/20 0500 02/14/20 0500  AST 530* 328*  ALT 1,187* 913*  ALKPHOS 80 80  BILITOT 2.1* 1.4*  PROT 8.0 7.9  ALBUMIN 3.3* 3.3*   No results for input(s): LIPASE, AMYLASE in the last 72 hours. Cardiac Enzymes No results for input(s): CKTOTAL, CKMB, CKMBINDEX, TROPONINI in the last 72 hours.  BNP: BNP (last 3 results) Recent Labs    02/07/20 1450  BNP 862.2*    ProBNP (last 3 results) No results for input(s): PROBNP in the last 8760 hours.   D-Dimer No results for input(s): DDIMER in the last 72 hours. Hemoglobin A1C No results for input(s): HGBA1C in the last 72 hours. Fasting Lipid Panel No results for input(s): CHOL, HDL, LDLCALC, TRIG, CHOLHDL, LDLDIRECT in the last 72 hours. Thyroid Function Tests No results for input(s): TSH, T4TOTAL,  T3FREE, THYROIDAB in the last 72 hours.  Invalid input(s): FREET3  Other results:   Imaging    No results found.   Medications:     Scheduled Medications:  amiodarone  200 mg Oral Daily   Chlorhexidine Gluconate Cloth  6 each Topical Daily   digoxin  0.125 mg Oral Daily   feeding supplement (ENSURE ENLIVE)  237 mL Oral BID BM   multivitamin with minerals  1 tablet Oral Daily   nicotine  14 mg Transdermal Daily   oxymetazoline  1 spray Each Nare BID   spironolactone  25 mg Oral Daily   torsemide  40 mg Oral Daily    Infusions:  dextrose     heparin 1,450 Units/hr (02/14/20 0800)   milrinone 0.125 mcg/kg/min (02/13/20 0650)    PRN Medications: alum & mag hydroxide-simeth, guaiFENesin-dextromethorphan, morphine injection, white petrolatum    Patient Profile   [name] a 34 year old malewith a hx of Ebstein's anomaly syndrome s/p repair 2011 whom we are asked to see by Dr.  Addison Lank for cardiogenic shock  He has a hx ofEbstein's anomaly syndromewith repair at age 43yo.He reports he was initially diagnosed after collapsing during football practice.He states he was previously treated for unknown arrhythmia in the postsurgical setting with digoxin and beta-blocker. He was only on medication for short period of time.He has not followed with cardiology care in many years.   Assessment/Plan   1. 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 Ebstein anomaly s/p repair with severe TR and to-fro flow in hepatic veins - baseline RV function and degree of TR unclear suspect decompensation related to AFL  - admitted w/ cardiogenic shock, initial co-ox 45% - Started on milrinone, 0.25. CO-OX down to 45% on 9/17 and milrinone increased to 0.375.  - On milrinone 0.125. Co-ox stable at 70%.  - Stop milrinone today and follow co-ox  - Volume status looks good. 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  - Continue spiro 25 - continue digoxin 0.125 mg (dig level ok) - start losartan 12.5 mg  - Need to review TEE with Dr. Cornelius Moras to discuss options for surgical repair  2. Atrial flutter with RVR - converted to NSR on IV amio. Rates well controlled  - on amio 200 daily. Watch QT - continue IV heparin. Switch to Eliquis when surgical plan decided - Dr. Addison Lank has discussed with EP and seems to be 1:1 RA circuit. Can consider eventual ablation  3. AKI - due to shock - resolved  4. Shock liver - due to cardiogenic shock. hepatitis panels negative - improving w/ inotropic support   5. Hyperkalemia/ Hypokalemia - improved Keep K > 4.0  6. 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 ID:  - PCT elevated at 9.0 - On CTX  for possible cholecystitis though I suspect GB issues due to severe RV failure - No AF. PCT coming down.  - Would give 7 days total  8. Prolonged QT - now on po amio - monitor closely  - d/w PharmD and MAR adjusted.   9. Depression - says he has tried anti-depressants before and they don't help. Currently not a candidate for one with QT prolongation as well.   10. LUE Superficial Thrombophlebitis - supportive care  w/ heating pads  - improved  11. Insomnia - add melatonin at bedtime   Length of Stay: [address] PA-C  02/14/2020, 8:44 AM  Advanced Heart Failure Team Pager [phone] (M-F; 7a - 4p)  Please contact [facility] for night-coverage after hours (4p -7a ) and weekends on [email or web address]   Patient seen and examined with the above-signed Advanced Practice Provider and/or Housestaff. I personally reviewed laboratory data, imaging studies and relevant notes. I independently examined the patient and formulated the important aspects of the plan. I have edited the note to reflect any of my changes or salient points. I have personally discussed the plan with the patient and/or family.  On milrinone 0.125. Co-ox 70%  Feels ok today. Remains in NSR. No bleeding on heparin.   General:  Well appearing. No resp difficulty HEENT: normal Neck: supple. JVP to jaw . Carotids 2+ bilat; no bruits. No lymphadenopathy or thryomegaly appreciated. Cor: PMI nondisplaced. Regular rate & rhythm. 2/6 TR Lungs: clear Abdomen: soft, nontender, nondistended. No hepatosplenomegaly. No bruits or masses. Good bowel sounds. Extremities: no cyanosis, clubbing, rash, edema Neuro: alert & orientedx3, cranial nerves grossly intact. moves all 4 extremities w/o difficulty. Affect pleasant  Continues to improve with inotropic support and maintenance of NSR. Can stop milrinone today. Switch heparin to Eliquis.   I have reviewed his case and TEE with structural heart team and CT surgery. Also d/w Dr.  Deatra James at [facility]. Consensus seems to be that valve is not repairable and his best option may be eventual transplant. I shared this today with him and his mother. I am hopeful LV function improves with maintenance of NSR.   Arvilla Meres, MD  2:50 PM

## 2020-02-14 NOTE — Plan of Care (Signed)

## 2020-02-14 NOTE — Progress Notes (Signed)
Transitions of Care Pharmacist Note  Perry Cook is a 34 y.o. male that has been diagnosed with atrial flutter and will be prescribed Eliquis (apixaban) at discharge.   Patient Education: I provided the following education on Eliquis to the patient: How to take the medication Described what the medication is for Signs of bleeding Answered their questions  Discharge Medications Plan: The patient wants to have their discharge medications filled by the Transitions of Care pharmacy rather than their usual pharmacy.  The discharge orders pharmacy has been changed to the Transitions of Care pharmacy, the patient will receive a phone call regarding co-pay, and their medications will be delivered by the Transitions of Care pharmacy.    Thank you,   Laverna Peace, PharmD PGY-1 Pharmacy Resident 02/14/2020 5:59 PM Please see AMION for all pharmacy numbers

## 2020-02-14 NOTE — Discharge Instructions (Signed)

## 2020-02-14 NOTE — Progress Notes (Signed)
Nutrition Follow-up  RD working remotely.  DOCUMENTATION CODES:   Severe malnutrition in context of acute illness/injury  INTERVENTION:   - Continue Ensure Enlive po BID, each supplement provides 350 kcal and 20 grams of protein  - Continue MVI with minerals daily  NUTRITION DIAGNOSIS:   Severe Malnutrition related to acute illness (acute on chronic RHF) as evidenced by mild fat depletion, severe muscle depletion, energy intake < or equal to 50% for > or equal to 5 days.  Ongoing  GOAL:   Patient will meet greater than or equal to 90% of their needs  Progressing  MONITOR:   PO intake, Supplement acceptance, Weight trends, Labs, I & O's  REASON FOR ASSESSMENT:   Malnutrition Screening Tool    ASSESSMENT:   Patient with PMH significant for Ebstein's anomaly syndrome s/p repair in 2002. Presents this admission with cardiogenic shock 2/2 acute on chronic RHF.  9/20 - s/p TEE showing LVEF 35-40%, severe RV dysfunction  Weight down 23 lbs overall since admission, likely related to diuresis with negative fluid balance of 14.5 L since admission.  Unable to reach pt via phone call to room. Overall meal completions have improved and pt accepting most Ensure Enlive supplements per [facility] documentation. Will continue with current regimen.  Meal Completion: 10-100% x last 8 meals (averaging 70%)  Medications reviewed and include: Ensure Enlive BID, MVI with minerals, spironolactone, torsemide  Labs reviewed: sodium 127, elevated LFTs CBG's: 98-142 x 24 hours  UOP: 1250 ml x 24 hours I/O's: -14.5 L since admit  Diet Order:   Diet Order            Diet Heart Room service appropriate? Yes; Fluid consistency: Thin  Diet effective now                 EDUCATION NEEDS:   Education needs have been addressed  Skin:  Skin Assessment: Reviewed RN Assessment  Last BM:  02/12/20 per pt report  Height:   Ht Readings from Last 1 Encounters:  02/06/20 6' (1.829 m)     Weight:   Wt Readings from Last 1 Encounters:  02/14/20 59.1 kg    BMI:  Body mass index is 17.67 kg/m.  Estimated Nutritional Needs:   Kcal:  2300-2500 kcal  Protein:  115-130 grams  Fluid:  >/= 2.3 L/day    Earma Reading, MS, RD, LDN Inpatient Clinical Dietitian Please see AMiON for contact information.

## 2020-02-15 LAB — BASIC METABOLIC PANEL
Anion gap: 10 (ref 5–15)
BUN: 36 mg/dL — ABNORMAL HIGH (ref 6–20)
CO2: 28 mmol/L (ref 22–32)
Calcium: 9.2 mg/dL (ref 8.9–10.3)
Chloride: 87 mmol/L — ABNORMAL LOW (ref 98–111)
Creatinine, Ser: 1.41 mg/dL — ABNORMAL HIGH (ref 0.61–1.24)
GFR calc Af Amer: >60 mL/min (ref >60)
GFR calc non Af Amer: >60 mL/min (ref >60)
Glucose, Bld: 109 mg/dL — ABNORMAL HIGH (ref 70–99)
Potassium: 4.5 mmol/L (ref 3.5–5.1)
Sodium: 125 mmol/L — ABNORMAL LOW (ref 135–145)

## 2020-02-15 LAB — CBC
HCT: 43.5 % (ref 39.0–52.0)
Hemoglobin: 14.7 g/dL (ref 13.0–17.0)
MCH: 28.3 pg (ref 26.0–34.0)
MCHC: 33.8 g/dL (ref 30.0–36.0)
MCV: 83.7 fL (ref 80.0–100.0)
Platelets: 247 10*3/uL (ref 150–400)
RBC: 5.2 MIL/uL (ref 4.22–5.81)
RDW: 15 % (ref 11.5–15.5)
WBC: 6.9 10*3/uL (ref 4.0–10.5)
nRBC: 0 % (ref 0.0–0.2)

## 2020-02-15 LAB — COOXEMETRY PANEL
Carboxyhemoglobin: 1.2 % (ref 0.5–1.5)
Carboxyhemoglobin: 1 % (ref 0.5–1.5)
Methemoglobin: 0.7 % (ref 0.0–1.5)
Methemoglobin: 0.6 % (ref 0.0–1.5)
O2 Saturation: 72.6 %
O2 Saturation: 62 %
Total hemoglobin: 15.3 g/dL (ref 12.0–16.0)
Total hemoglobin: 16.7 g/dL — ABNORMAL HIGH (ref 12.0–16.0)

## 2020-02-15 LAB — ABO/RH: ABO/RH(D): A POS

## 2020-02-15 NOTE — Progress Notes (Addendum)
Advanced Heart Failure Rounding Note  PCP-Cardiologist: Perry Fair, MD   Subjective:   Yesterday milrinone stopped. CO-OX 73%  Losartan added 9/22.Creatinine trending up 1.1>1.4   Feeling ok. Denies SOB.   Objective:   Weight Range: 59 kg Body mass index is 17.64 kg/m.   Vital Signs:   Temp:  [97.9 F (36.6 C)-98.5 F (36.9 C)] 97.9 F (36.6 C) (09/23 0412) Pulse Rate:  [70-75] 70 (09/23 0412) Resp:  [15-20] 15 (09/23 0412) BP: (100-119)/(51-71) 100/51 (09/23 0412) SpO2:  [96 %-98 %] 96 % (09/23 0412) Weight:  [59 kg] 59 kg (09/23 0423) Last BM Date: 02/12/20 (per patient)  Weight change: Filed Weights   02/13/20 0330 02/14/20 0409 02/15/20 0423  Weight: 60.4 kg 59.1 kg 59 kg    Intake/Output:   Intake/Output Summary (Last 24 hours) at 02/15/2020 0659 Last data filed at 02/15/2020 0400 Gross per 24 hour  Intake 1460.37 ml  Output 2500 ml  Net -1039.63 ml      Physical Exam  CVP 14  General: No resp difficulty HEENT: normal Neck: supple. JVP hard to assess.  Carotids 2+ bilat; no bruits. No lymphadenopathy or thryomegaly appreciated. RIJ  Cor: PMI nondisplaced. Regular rate & rhythm. No rubs, gallops +2/6 TR . Lungs: clear Abdomen: soft, nontender, nondistended. No hepatosplenomegaly. No bruits or masses. Good bowel sounds. Extremities: no cyanosis, clubbing, rash, edema Neuro: alert & orientedx3, cranial nerves grossly intact. moves all 4 extremities w/o difficulty. Affect pleasant  orientedx3, cranial nerves grossly intact. moves all 4 extremities w/o difficulty. Affect pleasant  Telemetry  SR 70s personally reviewed.   Labs    CBC Recent Labs    02/14/20 0500 02/15/20 0500  WBC 6.1 6.9  HGB 14.6 14.7  HCT 42.7 43.5  MCV 84.4 83.7  PLT 218 247   Basic Metabolic Panel Recent Labs    47/42/59 1051 02/13/20 0500 02/14/20 0500  NA  --  125* 127*  K  --  4.3 4.1  CL  --  88* 89*  CO2  --  28 28  GLUCOSE  --  91 114*  BUN  --   22* 23*  CREATININE  --  1.19 1.10  CALCIUM  --  9.0 9.2  MG 2.2  --   --    Liver Function Tests Recent Labs    02/13/20 0500 02/14/20 0500  AST 530* 328*  ALT 1,187* 913*  ALKPHOS 80 80  BILITOT 2.1* 1.4*  PROT 8.0 7.9  ALBUMIN 3.3* 3.3*   No results for input(s): LIPASE, AMYLASE in the last 72 hours. Cardiac Enzymes No results for input(s): CKTOTAL, CKMB, CKMBINDEX, TROPONINI in the last 72 hours.  BNP: BNP (last 3 results) Recent Labs    02/07/20 1450  BNP 862.2*    ProBNP (last 3 results) No results for input(s): PROBNP in the last 8760 hours.   D-Dimer No results for input(s): DDIMER in the last 72 hours. Hemoglobin A1C No results for input(s): HGBA1C in the last 72 hours. Fasting Lipid Panel No results for input(s): CHOL, HDL, LDLCALC, TRIG, CHOLHDL, LDLDIRECT in the last 72 hours. Thyroid Function Tests No results for input(s): TSH, T4TOTAL, T3FREE, THYROIDAB in the last 72 hours.  Invalid input(s): FREET3  Other results:   Imaging    No results found.   Medications:     Scheduled Medications: . amiodarone  200 mg Oral Daily  . apixaban  5 mg Oral BID  . Chlorhexidine Gluconate Cloth  6 each  Topical Daily  . digoxin  0.125 mg Oral Daily  . feeding supplement (ENSURE ENLIVE)  237 mL Oral BID BM  . losartan  12.5 mg Oral QHS  . melatonin  3 mg Oral QHS  . multivitamin with minerals  1 tablet Oral Daily  . nicotine  14 mg Transdermal Daily  . oxymetazoline  1 spray Each Nare BID  . spironolactone  25 mg Oral Daily  . torsemide  40 mg Oral Daily    Infusions: . dextrose      PRN Medications: alum & mag hydroxide-simeth, guaiFENesin-dextromethorphan, morphine injection, white petrolatum    Patient Profile   [name] a 34 year old malewith a hx of Ebstein's anomaly syndrome s/p repair 2011 whom we are asked to see by Dr. Addison Cook for cardiogenic shock  He has a hx ofEbstein's anomaly syndromewith repair at age  52yo.He reports he was initially diagnosed after collapsing during football practice.He states he was previously treated for unknown arrhythmia in the postsurgical setting with digoxin and beta-blocker. He was only on medication for short period of time.He has not followed with cardiology care in many years.   Assessment/Plan   1. 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 Ebstein anomaly s/p repair with severe TR and to-fro flow in hepatic veins - baseline RV function and degree of TR unclear suspect decompensation related to AFL  - admitted w/ cardiogenic shock, initial co-ox 45% - Started on milrinone, 0.25. CO-OX down to 45% on 9/17 and milrinone increased to 0.375.  - Milrinone stopped 9/22. Todays CO-OX 73%.    - Volume status stable. Continue torsemide 40 mg daily.  - Volume status looks good. 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  - Continue spiro 25 - continue digoxin 0.125 mg (dig level ok) - Losartan added 9/22. Creatinine trending up 1.1>1.4. Will stop losartan.  - Blood Type A+   2. Atrial flutter with RVR - converted to NSR on IV amio. Maintaining NSR.  - Continue amio 200 daily. Watch QT - Continue Eliquis 5 mg twice a day. Check with TOC coverage for eliquis.   3. AKI - due to shock - Creatinine up today after addition of losartan. 1.1>1.4 . Stop losartan as noted above.  - Check BMET in am.   4. Shock liver - due to cardiogenic shock. hepatitis panels negative - Had been trending down.   5. Hyperkalemia/ Hypokalemia - improved Keep K > 4.0  6. Severe TR - Ebstein anomaly s/p repair with severe TR - Dr Perry Cook discussed case and TEE with structural heart team and CT surgery. Also d/w Dr. Deatra Cook at [facility]. Consensus seems to be that  valve is not repairable and his best option may be eventual transplant - blood Type A+  - 7. ID:  - PCT elevated at 9.0 - On CTX for possible cholecystitis though I suspect GB issues due to severe RV failure - No AF. PCT coming down.  - Would give 7 days total  8. Prolonged QT - now on po amio - monitor closely  - d/w PharmD and MAR adjusted.   9. Depression - says he has tried anti-depressants before and they don't help. Currently not a candidate for one with QT prolongation as well.   10. LUE Superficial Thrombophlebitis - supportive care w/  heating pads  - improved  11. Insomnia - Continue melatonin at bedtime   12. Hyponatremia Sodium 125 today. Restrict free water.   He will need close follow up in HF clinic.   Length of Stay: 9  Amy Clegg, NP  02/15/2020, 6:59 AM  Advanced Heart Failure Team Pager [phone] (M-F; 7a - 4p)  Please contact [facility] for night-coverage after hours (4p -7a ) and weekends on [email or web address]  Patient seen and examined with the above-signed Advanced Practice Provider and/or Housestaff. I personally reviewed laboratory data, imaging studies and relevant notes. I independently examined the patient and formulated the important aspects of the plan. I have edited the note to reflect any of my changes or salient points. I have personally discussed the plan with the patient and/or family.  Maintaining NSR. Co-ox ok off milrinone. CVP ok. Sodium down. Creatinine up. Losartan stopped. Blood type A. Weight stable   General:  Well appearing. No resp difficulty HEENT: normal Neck: supple. no JVD. Carotids 2+ bilat; no bruits. No lymphadenopathy or thryomegaly appreciated. Cor: PMI nondisplaced. Regular rate & rhythm. No rubs, gallops or murmurs. Lungs: clear Abdomen: soft, nontender, nondistended. No hepatosplenomegaly. No bruits or masses. Good bowel sounds. Extremities: no cyanosis, clubbing, rash, edema Neuro: alert & orientedx3, cranial nerves  grossly intact. moves all 4 extremities w/o difficulty. Affect pleasant  In NSR. Hemodynamically stable off milrinone x 24 hours. Sodium down and creatinine up. Will need to watch at least one more day. Likely will need transplant w/u in near future. Hopeful LV will recover with restoration of NSR.   Arvilla Meres, MD  9:00 AM

## 2020-02-15 NOTE — Plan of Care (Signed)

## 2020-02-16 ENCOUNTER — Telehealth (HOSPITAL_COMMUNITY): Payer: Self-pay | Admitting: Pharmacy Technician

## 2020-02-16 LAB — CBC
HCT: 43.8 % (ref 39.0–52.0)
Hemoglobin: 14.7 g/dL (ref 13.0–17.0)
MCH: 28.2 pg (ref 26.0–34.0)
MCHC: 33.6 g/dL (ref 30.0–36.0)
MCV: 84.1 fL (ref 80.0–100.0)
Platelets: 241 10*3/uL (ref 150–400)
RBC: 5.21 MIL/uL (ref 4.22–5.81)
RDW: 15 % (ref 11.5–15.5)
WBC: 6.6 10*3/uL (ref 4.0–10.5)
nRBC: 0 % (ref 0.0–0.2)

## 2020-02-16 LAB — BASIC METABOLIC PANEL
Anion gap: 9 (ref 5–15)
Anion gap: 5 (ref 5–15)
BUN: 43 mg/dL — ABNORMAL HIGH (ref 6–20)
BUN: 32 mg/dL — ABNORMAL HIGH (ref 6–20)
CO2: 30 mmol/L (ref 22–32)
CO2: 27 mmol/L (ref 22–32)
Calcium: 9.3 mg/dL (ref 8.9–10.3)
Calcium: 8.7 mg/dL — ABNORMAL LOW (ref 8.9–10.3)
Chloride: 86 mmol/L — ABNORMAL LOW (ref 98–111)
Chloride: 94 mmol/L — ABNORMAL LOW (ref 98–111)
Creatinine, Ser: 1.4 mg/dL — ABNORMAL HIGH (ref 0.61–1.24)
Creatinine, Ser: 1.23 mg/dL (ref 0.61–1.24)
GFR calc Af Amer: >60 mL/min (ref >60)
GFR calc Af Amer: >60 mL/min (ref >60)
GFR calc non Af Amer: >60 mL/min (ref >60)
GFR calc non Af Amer: >60 mL/min (ref >60)
Glucose, Bld: 98 mg/dL (ref 70–99)
Glucose, Bld: 96 mg/dL (ref 70–99)
Potassium: 5.4 mmol/L — ABNORMAL HIGH (ref 3.5–5.1)
Potassium: 4.8 mmol/L (ref 3.5–5.1)
Sodium: 125 mmol/L — ABNORMAL LOW (ref 135–145)
Sodium: 126 mmol/L — ABNORMAL LOW (ref 135–145)

## 2020-02-16 LAB — COOXEMETRY PANEL
Carboxyhemoglobin: 1.8 % — ABNORMAL HIGH (ref 0.5–1.5)
Methemoglobin: 1 % (ref 0.0–1.5)
O2 Saturation: 73.5 %
Total hemoglobin: 15.6 g/dL (ref 12.0–16.0)

## 2020-02-16 MED ORDER — SODIUM CHLORIDE 0.9 % IV SOLN: Freq: Once | INTRAVENOUS | Status: DC

## 2020-02-16 MED ORDER — DIGOXIN 125 MCG PO TABS: 0.1250 mg | ORAL_TABLET | Freq: Every day | ORAL | 6 refills | Status: DC

## 2020-02-16 MED ORDER — SODIUM CHLORIDE 0.9 % IV SOLN: INTRAVENOUS | Status: AC

## 2020-02-16 MED ORDER — SPIRONOLACTONE 25 MG PO TABS: 12.5000 mg | ORAL_TABLET | Freq: Every day | ORAL | 11 refills | Status: DC

## 2020-02-16 MED ORDER — ACETAMINOPHEN 325 MG PO TABS
650.0000 mg | ORAL_TABLET | Freq: Four times a day (QID) | ORAL | Status: DC | PRN
  Administered 2020-02-16: 650 mg via ORAL
  Filled 2020-02-16: qty 2

## 2020-02-16 MED ORDER — SODIUM CHLORIDE 0.9 % IV BOLUS
500.0000 mL | Freq: Once | INTRAVENOUS | Status: AC
  Administered 2020-02-16: 500 mL via INTRAVENOUS

## 2020-02-16 MED ORDER — SODIUM CHLORIDE 0.9 % IV BOLUS: 500.0000 mL | Freq: Once | INTRAVENOUS | Status: DC

## 2020-02-16 MED ORDER — APIXABAN 5 MG PO TABS: 5.0000 mg | ORAL_TABLET | Freq: Two times a day (BID) | ORAL | 6 refills | Status: DC

## 2020-02-16 MED ORDER — AMIODARONE HCL 200 MG PO TABS: 200.0000 mg | ORAL_TABLET | Freq: Every day | ORAL | 6 refills | Status: DC

## 2020-02-16 MED FILL — ELIQUIS 5 MG TABLET: 5 | 30 days supply | Qty: 60 | Fill #0

## 2020-02-16 MED FILL — DIGOXIN 0.125 MG TABLET: 125 | 30 days supply | Qty: 30 | Fill #0

## 2020-02-16 MED FILL — AMIODARONE HCL 200 MG TAB: 200 | 30 days supply | Qty: 30 | Fill #0

## 2020-02-16 NOTE — Plan of Care (Signed)

## 2020-02-16 NOTE — Discharge Summary (Signed)
Advanced Heart Failure Team  Discharge Summary   Patient ID: Perry Cook MRN: [id number], DOB/AGE: 10-10-1985 34 y.o. Admit date: 02/06/2020 D/C date:     02/18/2020   Primary Discharge Diagnoses:  1. 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 Atrial flutter with RVR 3.AKI 4. Shock liver 5. Hyperkalemia/ Hypokalemia 6. Severe TR 7. ID:  8. Prolonged QT 9. Depression 10. LUE Superficial Thrombophlebitis 11. Insomnia 12. Hyponatremia   Hospital Course:  34 y/o AAM w/ Ebstein's anomaly, Transferred to [facility] for shock management in setting of severe RV failure and A fib RVR.   Mixed venous saturation was low and CVP > 20. Started on amio drip + milrinone + IV lasix. Once diuresed, milrinone was gradually weaned off. He did not require diuretics at discharge but may need to add at his follow up. Overall diuresed 24 pounds.   Chemically converted to NSR. Plan to continue 200 mg amio daily + eliquis.   He was placed on losartan and spiro but developed AKI and hyperkalemia.   Over past few days developed hyponatremia. Given tolvaptan 15mg  yesterday with quick correction. Given D5W to avoid overly rapid correction.   Feels good today. Anxious to go home. CVP 9 Na 135. Remains in NSR. QT improving.   He will continue to be followed closely in the HF clinic. Follow up next week with EKG and BMET. He was provided with 30 days of meds at discharge.   Will need to begin torsemide 20 daily prn to protect weight 132-135. Will start at Clinic visit on Tuesday 9/28  See below for detailed problem list.     1. 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 Ebstein anomaly s/p repair with severe TR and to-fro flow in hepatic veins - baseline RV function and  degree of TR unclear suspect decompensation related to AFL  - admitted w/ cardiogenic shock, initial co-ox 45% so milrinone 0.25 mcg started. Milrinone increased to 0.375 mcg . As he improved Milrinone eventually weaned off. Todays CO-OX 73%.    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   - continue digoxin 0.125 mg (dig level ok) - Losartan and spiro added but later stopped due to elevated creatinine.   - Blood Type A+   2. Atrial flutter with RVR - converted to NSR on IV amio - Maintaining amio 200 daily.  - Continue Eliquis 5 mg twice a day.   3.AKI - due to shock - Creatinine peaked at 1.4   - Losartan and spiro stopped   4. Shock liver - due to cardiogenic shock. hepatitis panels negative - Had been trending down.   5. Hyperkalemia/ Hypokalemia - K normal today  6. Severe TR - Ebstein anomaly s/p repair with severe TR - Dr 02/09/20 discussed case and TEE with structural heart team and CT surgery. Also d/w Dr. Gala Romney at [facility]. Consensus seems to be that valve is not repairable and his best option may be eventual transplant - blood Type A+  - 7. ID:  - PCT elevated at 9.0 - On CTX for possible cholecystitis though I suspect GB issues due to severe RV failure - No AF. PCT coming down.  - Would give 7 days total  8. Prolonged QT - now on po amio -  monitor closely  - d/w PharmD and MAR adjusted.   9. Depression - says he has tried anti-depressants before and they don't help. Currently not a candidate for one with QT prolongation as well.   10. LUE Superficial Thrombophlebitis - resolved  11. Insomnia - Continue melatonin at bedtime   12. Hyponatremia - Sodium improved quickly with one dose tolvaptan. Given some free water to avoid ooverly rapid correction    Discharge Vitals: Blood pressure 110/67, pulse 78, temperature 98.2 F (36.8 C), temperature source Oral, resp. rate 17, height 6' (1.829 m), weight 59.7  kg, SpO2 98 %.  General:  Well appearing. No resp difficulty HEENT: normal Neck: supple. JVP 9 Carotids 2+ bilat; no bruits. No lymphadenopathy or thryomegaly appreciated. Cor: PMI nondisplaced. Regular rate & rhythm. 2/6 TR Lungs: clear Abdomen: soft, nontender, nondistended. No hepatosplenomegaly. No bruits or masses. Good bowel sounds. Extremities: no cyanosis, clubbing, rash, edema Neuro: alert & orientedx3, cranial nerves grossly intact. moves all 4 extremities w/o difficulty. Affect pleasant   Labs: Lab Results  Component Value Date   WBC 3.8 (L) 02/18/2020   HGB 13.6 02/18/2020   HCT 41.9 02/18/2020   MCV 86.6 02/18/2020   PLT 225 02/18/2020    Recent Labs  Lab 02/14/20 0500 02/15/20 0625 02/18/20 0325  NA 127*   < > 136  K 4.1   < > 4.4  CL 89*   < > 102  CO2 28   < > 26  BUN 23*   < > 15  CREATININE 1.10   < > 0.94  CALCIUM 9.2   < > 9.5  PROT 7.9  --   --   BILITOT 1.4*  --   --   ALKPHOS 80  --   --   ALT 913*  --   --   AST 328*  --   --   GLUCOSE 114*   < > 113*   < > = values in this interval not displayed.   No results found for: CHOL, HDL, LDLCALC, TRIG BNP (last 3 results) Recent Labs    02/07/20 1450  BNP 862.2*    ProBNP (last 3 results) No results for input(s): PROBNP in the last 8760 hours.   Diagnostic Studies/Procedures   No results found.  Discharge Medications   Allergies as of 02/18/2020   No Known Allergies     Medication List    TAKE these medications   amiodarone 200 MG tablet Commonly known as: PACERONE Take 1 tablet (200 mg total) by mouth daily.   apixaban 5 MG Tabs tablet Commonly known as: ELIQUIS Take 1 tablet (5 mg total) by mouth 2 (two) times daily.   digoxin 0.125 MG tablet Commonly known as: LANOXIN Take 1 tablet (0.125 mg total) by mouth daily.        Will need to begin torsemide 20 daily prn to protect weight 132-135. Will start at Clinic visit on Tuesday 9/28  Disposition   The patient will  be discharged in stable condition to home. Discharge Instructions    (HEART FAILURE PATIENTS) Call MD:  Anytime you have any of the following symptoms: 1) 3 pound weight gain in 24 hours or 5 pounds in 1 week 2) shortness of breath, with or without a dry hacking cough 3) swelling in the hands, feet or stomach 4) if you have to sleep on extra pillows at night in order to breathe.   Complete by: As directed    Diet - low  sodium heart healthy   Complete by: As directed    Heart Failure patients record your daily weight using the same scale at the same time of day   Complete by: As directed    Increase activity slowly   Complete by: As directed       Follow-up Information    [facility] Follow up on 03/13/2020.   Why: 2:30 will see Dr. Alvis Lemmings, please come 15 minutes early.  Contact information: [address] [place] [address] [phone]       [facility]. Go on 02/27/2020.   Specialty: Cardiology Why: 12:00 AM, [facility], Entrance C, parking code [id number] Contact information: [address] [id number] [address] [address] [place] [address] [phone]                Will need to begin torsemide 20 daily prn to protect weight 132-135. Will start at Clinic visit on Tuesday 9/28    Duration of Discharge Encounter: Greater than 35 minutes   Signed, NP-C  Arvilla Meres, MD  11:39 AM

## 2020-02-16 NOTE — TOC Transition Note (Signed)
Transition of Care [facility]) - CM/SW Discharge Note   Patient Details  Name: Ercel Pepitone MRN: [id number] Date of Birth: 1986-03-27  [facility]) CM/SW Contact:  Leone Haven, RN Phone Number: 02/16/2020, 4:18 PM   Clinical Narrative:    Patient is for dc today, NCM gave him the [facility] clinic brochure with patient assitance application for eliquis.  Also TOC pharmacy is filling the first 30 days for patient for the eliquis.  Patient states he has transportation .  NCM informed patient to go to [facility] clinic for refill on the eliquis for 10.00 while he waits on the patient ast.   Final next level of care: Home/Self Care Barriers to Discharge: No Barriers Identified   Patient Goals and CMS Choice Patient states their goals for this hospitalization and ongoing recovery are:: get better   Choice offered to / list presented to : NA  Discharge Placement                       Discharge Plan and Services                  DME Agency: NA       HH Arranged: NA          Social Determinants of Health (SDOH) Interventions     Readmission Risk Interventions No flowsheet data found.

## 2020-02-16 NOTE — Telephone Encounter (Signed)
Sent in application via fax.  Will follow up.  

## 2020-02-16 NOTE — Telephone Encounter (Signed)
Patient has been started on Eliquis and has no insurance. Sent in application for BMS via fax.  Will follow up.

## 2020-02-16 NOTE — Care Management (Signed)
MATCH completed for medication assistance prescriptions will be sent to [facility] pharmacy.

## 2020-02-16 NOTE — Progress Notes (Addendum)
Advanced Heart Failure Rounding Note  PCP-Cardiologist: Thurmon Fair, MD   Subjective:   Yesterday losartan stopped. Creatinine trending up 1.1>1.4 >1.4   K 5.4    Denies SOB.    Objective:   Weight Range: 60.3 kg Body mass index is 18.03 kg/m.   Vital Signs:   Temp:  [98 F (36.7 C)-98.5 F (36.9 C)] 98.1 F (36.7 C) (09/24 0433) Pulse Rate:  [68-87] 68 (09/24 0433) Resp:  [16-20] 16 (09/24 0433) BP: (95-116)/(53-64) 109/64 (09/24 0433) SpO2:  [95 %-100 %] 99 % (09/24 0433) Weight:  [60.3 kg] 60.3 kg (09/24 0433) Last BM Date: 02/12/20 (per patient)  Weight change: Filed Weights   02/14/20 0409 02/15/20 0423 02/16/20 0433  Weight: 59.1 kg 59 kg 60.3 kg    Intake/Output:   Intake/Output Summary (Last 24 hours) at 02/16/2020 0747 Last data filed at 02/16/2020 0434 Gross per 24 hour  Intake --  Output 1375 ml  Net -1375 ml      Physical Exam  CVP 8  General:  No resp difficulty HEENT: normal Neck: supple. no JVD. Carotids 2+ bilat; no bruits. No lymphadenopathy or thryomegaly appreciated. Cor: PMI nondisplaced. Regular rate & rhythm. No rubs, gallops. 2/6 TR. Lungs: clear Abdomen: soft, nontender, nondistended. No hepatosplenomegaly. No bruits or masses. Good bowel sounds. Extremities: no cyanosis, clubbing, rash, edema Neuro: alert & orientedx3, cranial nerves grossly intact. moves all 4 extremities w/o difficulty. Affect pleasant   Telemetry  SR 70s   Labs    CBC Recent Labs    02/15/20 0500 02/16/20 0443  WBC 6.9 6.6  HGB 14.7 14.7  HCT 43.5 43.8  MCV 83.7 84.1  PLT 247 241   Basic Metabolic Panel Recent Labs    03/00/92 0625 02/16/20 0443  NA 125* 125*  K 4.5 5.4*  CL 87* 86*  CO2 28 30  GLUCOSE 109* 98  BUN 36* 43*  CREATININE 1.41* 1.40*  CALCIUM 9.2 9.3   Liver Function Tests Recent Labs    02/14/20 0500  AST 328*  ALT 913*  ALKPHOS 80  BILITOT 1.4*  PROT 7.9  ALBUMIN 3.3*   No results for input(s):  LIPASE, AMYLASE in the last 72 hours. Cardiac Enzymes No results for input(s): CKTOTAL, CKMB, CKMBINDEX, TROPONINI in the last 72 hours.  BNP: BNP (last 3 results) Recent Labs    02/07/20 1450  BNP 862.2*    ProBNP (last 3 results) No results for input(s): PROBNP in the last 8760 hours.   D-Dimer No results for input(s): DDIMER in the last 72 hours. Hemoglobin A1C No results for input(s): HGBA1C in the last 72 hours. Fasting Lipid Panel No results for input(s): CHOL, HDL, LDLCALC, TRIG, CHOLHDL, LDLDIRECT in the last 72 hours. Thyroid Function Tests No results for input(s): TSH, T4TOTAL, T3FREE, THYROIDAB in the last 72 hours.  Invalid input(s): FREET3  Other results:   Imaging    No results found.   Medications:     Scheduled Medications: . amiodarone  200 mg Oral Daily  . apixaban  5 mg Oral BID  . Chlorhexidine Gluconate Cloth  6 each Topical Daily  . digoxin  0.125 mg Oral Daily  . feeding supplement (ENSURE ENLIVE)  237 mL Oral BID BM  . melatonin  3 mg Oral QHS  . multivitamin with minerals  1 tablet Oral Daily  . nicotine  14 mg Transdermal Daily  . oxymetazoline  1 spray Each Nare BID  . torsemide  40 mg Oral  Daily    Infusions: . dextrose      PRN Medications: alum & mag hydroxide-simeth, guaiFENesin-dextromethorphan, morphine injection, white petrolatum    Patient Profile   [name] a 34 year old malewith a hx of Ebstein's anomaly syndrome s/p repair 2011 whom we are asked to see by Dr. Addison Lank for cardiogenic shock  He has a hx ofEbstein's anomaly syndromewith repair at age 34yo.He reports he was initially diagnosed after collapsing during football practice.He states he was previously treated for unknown arrhythmia in the postsurgical setting with digoxin and beta-blocker. He was only on medication for short period of time.He has not followed with cardiology care in many years.   Assessment/Plan   1. Cariogenic  shock with biventricular HF (R>>L) in setting of Ebstein's anomaly s/p repair with advanced RHF and aatrial flutter - echo 02/07/20 LVEF 40% RV massively dilated and severely HK. Ebstein anomaly s/p repair with severe TR and to-fro flow in hepatic veins - baseline RV function and degree of TR unclear suspect decompensation related to AFL  - admitted w/ cardiogenic shock, initial co-ox 45% - Started on milrinone, 0.25. CO-OX down to 45% on 9/17 and milrinone increased to 0.375.  - Milrinone stopped 9/22. Todays CO-OX 73%.    - Volume status stable. 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  -K up to 5.4. Stop spiro.  - continue digoxin 0.125 mg (dig level ok) - Losartan added 9/22. Creatinine trending up 1.1>1.4. Losartan was stopped 02/15/20. Creatinine unchanged 1.4 today.  - Blood Type A+   2. Atrial flutter with RVR - converted to NSR on IV amio - Maintaining amio 200 daily. Watch QT - Continue Eliquis 5 mg twice a day. Check with TOC coverage for eliquis.   3. AKI - due to shock - Creatinine up today after addition of losartan. 1.1>1.4>1.4  - Losartan stopped 9/23.  Stop spiro.   4. Shock liver - due to cardiogenic shock. hepatitis panels negative - Had been trending down.   5. Hyperkalemia/ Hypokalemia - K elevated today. Stopping spiro.   6. 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 Also d/w Dr. Deatra James at [facility]. Consensus seems to be that valve is not repairable and his best option may be eventual transplant - blood Type A+  - 7. ID:  - PCT elevated at 9.0 - On CTX for possible cholecystitis though I suspect GB issues due to severe RV failure - No AF. PCT coming down.  - Would give 7 days total  8. Prolonged QT - now on po amio - monitor closely  - d/w PharmD and MAR adjusted.   9. Depression - says  he has tried anti-depressants before and they don't help. Currently not a candidate for one with QT prolongation as well.   10. LUE Superficial Thrombophlebitis - supportive care w/ heating pads  - improved  11. Insomnia - Continue melatonin at bedtime   12. Hyponatremia Sodium remains 125 today. Restrict free water.     Length of Stay: 10  Tonye Becket, NP  02/16/2020, 7:47 AM  Advanced Heart Failure Team Pager [phone] (M-F; 7a - 4p)  Please contact [facility] for night-coverage after hours (4p -7a ) and weekends on [email or web address]  Patient seen and examined with the above-signed Advanced Practice Provider and/or Housestaff. I personally reviewed laboratory data, imaging studies and relevant  notes. I independently examined the patient and formulated the important aspects of the plan. I have edited the note to reflect any of my changes or salient points. I have personally discussed the plan with the patient and/or family.  Feels ok today. CVP down. Co-ox ok off milrinone but sodium 125 and k up to 5.4. Remains in NSR.   General:  Lying in bed No resp difficulty HEENT: normal Neck: supple. no JVD. Carotids 2+ bilat; no bruits. No lymphadenopathy or thryomegaly appreciated. Cor: PMI nondisplaced. Regular rate & rhythm. 2/6 TR Lungs: clear Abdomen: soft, nontender, nondistended. No hepatosplenomegaly. No bruits or masses. Good bowel sounds. Extremities: no cyanosis, clubbing, rash, edema Neuro: alert & orientedx3, cranial nerves grossly intact. moves all 4 extremities w/o difficulty. Affect pleasant  Will give some IVF today and recheck electrolytes, may be able to go today versus tomorrow. Suspect he will eventually need transplant w/u.  Arvilla Meres, MD  10:37 AM

## 2020-02-16 NOTE — Plan of Care (Signed)
  Problem: Education: Goal: Knowledge of General Education information will improve Description: Including pain rating scale, medication(s)/side effects and non-pharmacologic comfort measures Outcome: Progressing   Problem: Clinical Measurements: Goal: Ability to maintain clinical measurements within normal limits will improve Outcome: Progressing Goal: Will remain free from infection Outcome: Progressing Goal: Diagnostic test results will improve Outcome: Progressing   Problem: Activity: Goal: Risk for activity intolerance will decrease Outcome: Progressing   Problem: Nutrition: Goal: Adequate nutrition will be maintained Outcome: Progressing   Problem: Coping: Goal: Level of anxiety will decrease Outcome: Progressing   Problem: Pain Managment: Goal: General experience of comfort will improve Outcome: Progressing

## 2020-02-17 LAB — BASIC METABOLIC PANEL
Anion gap: 8 (ref 5–15)
Anion gap: 7 (ref 5–15)
BUN: 19 mg/dL (ref 6–20)
BUN: 17 mg/dL (ref 6–20)
CO2: 25 mmol/L (ref 22–32)
CO2: 25 mmol/L (ref 22–32)
Calcium: 9.1 mg/dL (ref 8.9–10.3)
Calcium: 9.4 mg/dL (ref 8.9–10.3)
Chloride: 101 mmol/L (ref 98–111)
Chloride: 103 mmol/L (ref 98–111)
Creatinine, Ser: 1.04 mg/dL (ref 0.61–1.24)
Creatinine, Ser: 0.93 mg/dL (ref 0.61–1.24)
GFR calc Af Amer: >60 mL/min (ref >60)
GFR calc Af Amer: >60 mL/min (ref >60)
GFR calc non Af Amer: >60 mL/min (ref >60)
GFR calc non Af Amer: >60 mL/min (ref >60)
Glucose, Bld: 149 mg/dL — ABNORMAL HIGH (ref 70–99)
Glucose, Bld: 89 mg/dL (ref 70–99)
Potassium: 4.6 mmol/L (ref 3.5–5.1)
Potassium: 5.5 mmol/L — ABNORMAL HIGH (ref 3.5–5.1)
Sodium: 134 mmol/L — ABNORMAL LOW (ref 135–145)
Sodium: 135 mmol/L (ref 135–145)

## 2020-02-17 LAB — COOXEMETRY PANEL
Carboxyhemoglobin: 1.7 % — ABNORMAL HIGH (ref 0.5–1.5)
Methemoglobin: 0.9 % (ref 0.0–1.5)
O2 Saturation: 72.3 %
Total hemoglobin: 14.7 g/dL (ref 12.0–16.0)

## 2020-02-17 LAB — CBC
HCT: 42.1 % (ref 39.0–52.0)
Hemoglobin: 14.3 g/dL (ref 13.0–17.0)
MCH: 28.9 pg (ref 26.0–34.0)
MCHC: 34 g/dL (ref 30.0–36.0)
MCV: 85.2 fL (ref 80.0–100.0)
Platelets: 238 10*3/uL (ref 150–400)
RBC: 4.94 MIL/uL (ref 4.22–5.81)
RDW: 15.2 % (ref 11.5–15.5)
WBC: 4.3 10*3/uL (ref 4.0–10.5)
nRBC: 0 % (ref 0.0–0.2)

## 2020-02-17 LAB — BASIC METABOLIC PANEL WITH GFR
Anion gap: 6 (ref 5–15)
BUN: 22 mg/dL — ABNORMAL HIGH (ref 6–20)
CO2: 26 mmol/L (ref 22–32)
Calcium: 9.4 mg/dL (ref 8.9–10.3)
Chloride: 91 mmol/L — ABNORMAL LOW (ref 98–111)
Creatinine, Ser: 0.95 mg/dL (ref 0.61–1.24)
GFR calc Af Amer: >60 mL/min
GFR calc non Af Amer: >60 mL/min
Glucose, Bld: 90 mg/dL (ref 70–99)
Potassium: 5.5 mmol/L — ABNORMAL HIGH (ref 3.5–5.1)
Sodium: 123 mmol/L — ABNORMAL LOW (ref 135–145)

## 2020-02-17 LAB — OSMOLALITY, URINE: Osmolality, Ur: 440 mOsm/kg (ref 300–900)

## 2020-02-17 LAB — SODIUM, URINE, RANDOM: Sodium, Ur: 32 mmol/L

## 2020-02-17 LAB — OSMOLALITY: Osmolality: 282 mOsm/kg (ref 275–295)

## 2020-02-17 MED ORDER — DEXTROSE 5 % IV BOLUS
500.0000 mL | Freq: Once | INTRAVENOUS | Status: AC
  Administered 2020-02-17: 500 mL via INTRAVENOUS

## 2020-02-17 MED ORDER — FREE WATER: 200.0000 mL | Status: DC

## 2020-02-17 MED ORDER — SODIUM CHLORIDE 0.9 % IV SOLN: INTRAVENOUS | Status: AC

## 2020-02-17 MED ORDER — TOLVAPTAN 15 MG PO TABS
15.0000 mg | ORAL_TABLET | ORAL | Status: AC
  Administered 2020-02-17: 15 mg via ORAL
  Filled 2020-02-17: qty 1

## 2020-02-17 NOTE — Progress Notes (Signed)
CVP taken at 0800 = 14, CVP taken at 11:31=9---documented under ICU vital signs on flowsheets

## 2020-02-17 NOTE — TOC Transition Note (Signed)
Transition of Care [facility]) - CM/SW Discharge Note   Patient Details  Name: Mounir Skipper MRN: [id number] Date of Birth: 01-18-1986  Transition of Care [facility]) CM/SW Contact:  Kermit Balo, RN Phone Number: 02/17/2020, 10:33 AM   Clinical Narrative:    Pt discharging home with self care. Medications stored in pharmacy from [facility] pharmacy delivered to the room. Pt has transport home.   Final next level of care: Home/Self Care Barriers to Discharge: Inadequate or no insurance, Barriers Unresolved (comment)   Patient Goals and CMS Choice Patient states their goals for this hospitalization and ongoing recovery are:: get better   Choice offered to / list presented to : NA  Discharge Placement                       Discharge Plan and Services                  DME Agency: NA       HH Arranged: NA          Social Determinants of Health (SDOH) Interventions     Readmission Risk Interventions No flowsheet data found.

## 2020-02-17 NOTE — Progress Notes (Signed)
Advanced Heart Failure Rounding Note  PCP-Cardiologist: Thurmon Fair, MD   Subjective:    Diuretics stopped 2 days ago. Got 1L NS yesterday.   Still negative 2L. Weight stable.   Sodium 126 -> 123  K  4.8 -> 5.5  Co-ox 72%  Feels ok. No SOB, orthopnea or PND   Objective:   Weight Range: 60.2 kg Body mass index is 18 kg/m.   Vital Signs:   Temp:  [97.9 F (36.6 C)-98.5 F (36.9 C)] 98.5 F (36.9 C) (09/25 0305) Pulse Rate:  [70-77] 70 (09/25 0305) Resp:  [14-20] 14 (09/25 0305) BP: (110-125)/(58-66) 112/61 (09/25 0305) SpO2:  [96 %-100 %] 96 % (09/25 0305) Weight:  [60.2 kg] 60.2 kg (09/25 0648) Last BM Date: 02/16/20  Weight change: Filed Weights   02/15/20 0423 02/16/20 0433 02/17/20 0648  Weight: 59 kg 60.3 kg 60.2 kg    Intake/Output:   Intake/Output Summary (Last 24 hours) at 02/17/2020 0806 Last data filed at 02/17/2020 0300 Gross per 24 hour  Intake 778.78 ml  Output 3075 ml  Net -2296.22 ml      Physical Exam   General:  Well appearing. No resp difficulty HEENT: normal Neck: supple. no JVD. Carotids 2+ bilat; no bruits. No lymphadenopathy or thryomegaly appreciated. Cor: PMI nondisplaced. Regular rate & rhythm. 2/6 TR Lungs: clear Abdomen: soft, nontender, nondistended. No hepatosplenomegaly. No bruits or masses. Good bowel sounds. Extremities: no cyanosis, clubbing, rash, edema Neuro: alert & orientedx3, cranial nerves grossly intact. moves all 4 extremities w/o difficulty. Affect pleasant  Telemetry   SR 60-70s Personally reviewed  Labs    CBC Recent Labs    02/16/20 0443 02/17/20 0517  WBC 6.6 4.3  HGB 14.7 14.3  HCT 43.8 42.1  MCV 84.1 85.2  PLT 241 238   Basic Metabolic Panel Recent Labs    40/81/44 1411 02/17/20 0517  NA 126* 123*  K 4.8 5.5*  CL 94* 91*  CO2 27 26  GLUCOSE 96 90  BUN 32* 22*  CREATININE 1.23 0.95  CALCIUM 8.7* 9.4   Liver Function Tests No results for input(s): AST, ALT, ALKPHOS,  BILITOT, PROT, ALBUMIN in the last 72 hours. No results for input(s): LIPASE, AMYLASE in the last 72 hours. Cardiac Enzymes No results for input(s): CKTOTAL, CKMB, CKMBINDEX, TROPONINI in the last 72 hours.  BNP: BNP (last 3 results) Recent Labs    02/07/20 1450  BNP 862.2*    ProBNP (last 3 results) No results for input(s): PROBNP in the last 8760 hours.   D-Dimer No results for input(s): DDIMER in the last 72 hours. Hemoglobin A1C No results for input(s): HGBA1C in the last 72 hours. Fasting Lipid Panel No results for input(s): CHOL, HDL, LDLCALC, TRIG, CHOLHDL, LDLDIRECT in the last 72 hours. Thyroid Function Tests No results for input(s): TSH, T4TOTAL, T3FREE, THYROIDAB in the last 72 hours.  Invalid input(s): FREET3  Other results:   Imaging    No results found.   Medications:     Scheduled Medications: . amiodarone  200 mg Oral Daily  . apixaban  5 mg Oral BID  . Chlorhexidine Gluconate Cloth  6 each Topical Daily  . digoxin  0.125 mg Oral Daily  . feeding supplement (ENSURE ENLIVE)  237 mL Oral BID BM  . melatonin  3 mg Oral QHS  . multivitamin with minerals  1 tablet Oral Daily  . nicotine  14 mg Transdermal Daily  . oxymetazoline  1 spray Each Nare BID  Infusions: . dextrose      PRN Medications: acetaminophen, alum & mag hydroxide-simeth, guaiFENesin-dextromethorphan, morphine injection, white petrolatum    Patient Profile   [name] a 34 year old malewith a hx of Ebstein's anomaly syndrome s/p repair 2011 whom we are asked to see by Dr. Addison Lank for cardiogenic shock  He has a hx ofEbstein's anomaly syndromewith repair at age 34yo.He reports he was initially diagnosed after collapsing during football practice.He states he was previously treated for unknown arrhythmia in the postsurgical setting with digoxin and beta-blocker. He was only on medication for short period of time.He has not followed with cardiology care  in many years.   Assessment/Plan   1. 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 - admitted w/ cardiogenic shock, initial co-ox 45%, Improved with milrinone and 25 pound diuresis.  - Milrinone stopped 9/22. Todays CO-OX 72%.    - Volume status looks ok but is having progressive hyponatremia.  Hold diuretics again today. Check Osms. Given IVF again. Can give a dose of lasix as needed  - continue digoxin 0.125 mg (dig level ok) - spiro and losartan due to hyperkalemia - Suspect may need transplant. Blood Type A+   2. Atrial flutter with RVR - converted to NSR on IV amio - Maintaining amio 200 daily. Watch QT - Continue Eliquis 5 mg twice a day. Check with TOC coverage for eliquis.   3. AKI &Shock liver - resolved  4. 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 Also d/w Dr. Deatra James at [facility]. Consensus seems to be that valve is not repairable and his best option may be eventual transplant - blood Type A+  5. Prolonged QT - now on po amio - monitor closely  - d/w PharmD and MAR adjusted.   6. Hyponatremia - Sodium remains 123 today.  - Checking Una and urine/serum osms - D/w Renal. Will give more IVF today - Less likely SIADH due to amio   Length of Stay: 11  Arvilla Meres, MD  02/17/2020, 8:06 AM  Advanced Heart Failure Team Pager [phone] (M-F; 7a - 4p)  Please contact [facility] for night-coverage after hours (4p -7a ) and weekends on  [email or web address]

## 2020-02-18 LAB — COOXEMETRY PANEL
Carboxyhemoglobin: 1.6 % — ABNORMAL HIGH (ref 0.5–1.5)
Methemoglobin: 1.1 % (ref 0.0–1.5)
O2 Saturation: 78.2 %
Total hemoglobin: 14 g/dL (ref 12.0–16.0)

## 2020-02-18 LAB — CBC
HCT: 41.9 % (ref 39.0–52.0)
Hemoglobin: 13.6 g/dL (ref 13.0–17.0)
MCH: 28.1 pg (ref 26.0–34.0)
MCHC: 32.5 g/dL (ref 30.0–36.0)
MCV: 86.6 fL (ref 80.0–100.0)
Platelets: 225 10*3/uL (ref 150–400)
RBC: 4.84 MIL/uL (ref 4.22–5.81)
RDW: 15.6 % — ABNORMAL HIGH (ref 11.5–15.5)
WBC: 3.8 10*3/uL — ABNORMAL LOW (ref 4.0–10.5)
nRBC: 0 % (ref 0.0–0.2)

## 2020-02-18 LAB — BASIC METABOLIC PANEL
Anion gap: 8 (ref 5–15)
BUN: 15 mg/dL (ref 6–20)
CO2: 26 mmol/L (ref 22–32)
Calcium: 9.5 mg/dL (ref 8.9–10.3)
Chloride: 102 mmol/L (ref 98–111)
Creatinine, Ser: 0.94 mg/dL (ref 0.61–1.24)
GFR calc Af Amer: >60 mL/min (ref >60)
GFR calc non Af Amer: >60 mL/min (ref >60)
Glucose, Bld: 113 mg/dL — ABNORMAL HIGH (ref 70–99)
Potassium: 4.4 mmol/L (ref 3.5–5.1)
Sodium: 136 mmol/L (ref 135–145)

## 2020-02-18 NOTE — Plan of Care (Signed)
  Problem: Education: Goal: Knowledge of General Education information will improve Description: Including pain rating scale, medication(s)/side effects and non-pharmacologic comfort measures Outcome: Adequate for Discharge   Problem: Health Behavior/Discharge Planning: Goal: Ability to manage health-related needs will improve Outcome: Adequate for Discharge   Problem: Clinical Measurements: Goal: Ability to maintain clinical measurements within normal limits will improve Outcome: Adequate for Discharge Goal: Will remain free from infection Outcome: Adequate for Discharge Goal: Diagnostic test results will improve Outcome: Adequate for Discharge Goal: Respiratory complications will improve Outcome: Adequate for Discharge Goal: Cardiovascular complication will be avoided Outcome: Adequate for Discharge   Problem: Activity: Goal: Risk for activity intolerance will decrease Outcome: Adequate for Discharge   Problem: Nutrition: Goal: Adequate nutrition will be maintained Outcome: Adequate for Discharge   Problem: Coping: Goal: Level of anxiety will decrease Outcome: Adequate for Discharge   Problem: Elimination: Goal: Will not experience complications related to bowel motility Outcome: Adequate for Discharge Goal: Will not experience complications related to urinary retention Outcome: Adequate for Discharge   Problem: Pain Managment: Goal: General experience of comfort will improve Outcome: Adequate for Discharge   Problem: Safety: Goal: Ability to remain free from injury will improve Outcome: Adequate for Discharge   Problem: Skin Integrity: Goal: Risk for impaired skin integrity will decrease Outcome: Adequate for Discharge   Problem: Malnutrition  (NI-5.2) Goal: Food and/or nutrient delivery Description: Individualized approach for food/nutrient provision. Outcome: Adequate for Discharge   

## 2020-02-18 NOTE — Progress Notes (Signed)
Patient medications provided by [facility] pharmacy.  Discharge instructions reviewed with patient follow up appointments reviewed and patient verbalizes understanding.  Written copy of instructions given to patient.  Patient via wheel chair to waiting ride outside in stable condition

## 2020-02-20 ENCOUNTER — Other Ambulatory Visit: Payer: Self-pay

## 2020-02-20 ENCOUNTER — Ambulatory Visit (HOSPITAL_COMMUNITY)
Admit: 2020-02-20 | Discharge: 2020-02-20 | Disposition: A | Discharge status: Home / Self Care | Payer: Self-pay | Attending: Internal Medicine | Admitting: Internal Medicine

## 2020-02-20 VITALS — BP 120/65 | HR 87 | Wt 141.0 lb

## 2020-02-20 DIAGNOSIS — I5082 Biventricular heart failure: Secondary | ICD-10-CM | POA: Insufficient documentation

## 2020-02-20 DIAGNOSIS — I071 Rheumatic tricuspid insufficiency: Secondary | ICD-10-CM

## 2020-02-20 DIAGNOSIS — Z79899 Other long term (current) drug therapy: Secondary | ICD-10-CM | POA: Insufficient documentation

## 2020-02-20 DIAGNOSIS — Z7901 Long term (current) use of anticoagulants: Secondary | ICD-10-CM | POA: Insufficient documentation

## 2020-02-20 DIAGNOSIS — F329 Major depressive disorder, single episode, unspecified: Secondary | ICD-10-CM | POA: Insufficient documentation

## 2020-02-20 DIAGNOSIS — Q225 Ebstein's anomaly: Secondary | ICD-10-CM | POA: Insufficient documentation

## 2020-02-20 DIAGNOSIS — F17210 Nicotine dependence, cigarettes, uncomplicated: Secondary | ICD-10-CM | POA: Insufficient documentation

## 2020-02-20 DIAGNOSIS — I454 Nonspecific intraventricular block: Secondary | ICD-10-CM | POA: Insufficient documentation

## 2020-02-20 DIAGNOSIS — I4892 Unspecified atrial flutter: Secondary | ICD-10-CM | POA: Insufficient documentation

## 2020-02-20 LAB — DIGOXIN LEVEL: Digoxin Level: 1.3 ng/mL (ref 1.0–2.0)

## 2020-02-20 LAB — CBC
HCT: 37 % — ABNORMAL LOW (ref 39.0–52.0)
Hemoglobin: 11.9 g/dL — ABNORMAL LOW (ref 13.0–17.0)
MCH: 28.5 pg (ref 26.0–34.0)
MCHC: 32.2 g/dL (ref 30.0–36.0)
MCV: 88.7 fL (ref 80.0–100.0)
Platelets: 218 10*3/uL (ref 150–400)
RBC: 4.17 MIL/uL — ABNORMAL LOW (ref 4.22–5.81)
RDW: 15.6 % — ABNORMAL HIGH (ref 11.5–15.5)
WBC: 3.5 10*3/uL — ABNORMAL LOW (ref 4.0–10.5)
nRBC: 0 % (ref 0.0–0.2)

## 2020-02-20 LAB — BASIC METABOLIC PANEL
Anion gap: 9 (ref 5–15)
BUN: 9 mg/dL (ref 6–20)
CO2: 23 mmol/L (ref 22–32)
Calcium: 9.1 mg/dL (ref 8.9–10.3)
Chloride: 104 mmol/L (ref 98–111)
Creatinine, Ser: 0.85 mg/dL (ref 0.61–1.24)
GFR calc Af Amer: >60 mL/min (ref >60)
GFR calc non Af Amer: >60 mL/min (ref >60)
Glucose, Bld: 83 mg/dL (ref 70–99)
Potassium: 4.2 mmol/L (ref 3.5–5.1)
Sodium: 136 mmol/L (ref 135–145)

## 2020-02-20 MED ORDER — TORSEMIDE 20 MG PO TABS: 20.0000 mg | ORAL_TABLET | Freq: Every day | ORAL | 3 refills | Status: DC

## 2020-02-20 MED FILL — TORSEMIDE 20 MG TABLET: 20 | 30 days supply | Qty: 30 | Fill #0

## 2020-02-20 NOTE — Progress Notes (Signed)
Advanced Heart Failure Clinic Note   Referring Physician: PCP: Patient, No Pcp Per PCP-Cardiologist: Thurmon Fair, MD  AHFC: Dr. Gala Romney   Reason for Visit: [facility] F/u for Biventricular Heart Failure, Severe TR and Atrial Flutter   HPI:  Perry Cook a 34 year old malewith a hx of Ebstein's anomaly syndrome s/p repair 2011, recently admitted 9/21 for acute CHF>>cardiongenic shock in the setting of rapid atrial flutter.   He has a hx ofEbstein's anomaly syndromewith repair at age 63yo.He reports he was initially diagnosed after collapsing during football practice.He states he was previously treated for unknown arrhythmia in the postsurgical setting with digoxin and beta-blocker. He was only on medication for short period of time but unfortunately was lost to f/u.   He presented to the [facility] earlier this month w/ complaints of ongoing nausea and abdominal pain x 2 weeks. No fever or chills.  In ED, EKG showed AFL with rate at 177 bpm.He was given three doses of adenosine without conversion at which time he received two doses of metoprolol, both at 2.5mg  IV with conversion to NSR with rates in the 80's. He maintained NSR with marked 1st degree AVB for several hours then developed recurrent AFL with cardiogenic shock with LFTs > 10k and lactate 7.2. Creatinine 0.9 -> 1.6. AHF team consulted. CVC placed and initial co-ox 45%, started on milrinone. CVP in 20 range, started on IV Lasix. IV amio started and he converted to NSR. TTE showed LVEF 40% RV massively dilated and severely HK. Ebstein anomaly s/p repair with severe TR and to-fro flow in hepatic veins. 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 TEE confirmed severe TR. Case and TEE discussed w/ structural heart team and CT surgery. Also d/c Dr. Deatra James at [facility]. Consensus seems to be that valve is not repairable and his best option may be eventual transplant.  He diuresed well w/ IV  Lasix and was successfully weaned off of milrinone. He was transitioned off IV heparin>> Eliquis. IV amio>>PO. Digoxin also added. He was started on losartan and spiro but both meds discontinued given development of hyperkalemia. Also had hyponatremia, treated w/ Tolvaptan. He was not sent home on loop diuretic.    Today in f/u, he reports he feels well. Denies dyspnea. No fatigue. No recurrent abdominal pain/fullness, n/v. No LEE, orthopnea or PND. No palpitations. EKG shows NSR. He has been compliant w/ medications. BP stable. Denies abnormal bleeding. His wt however has increased ~10 lb since he was discharged. Has been trying to avoid salt. He does not have a scale at home.   2D Echo 9/21 1. Left ventricular ejection fraction, by estimation, is 35 to 40%. The  left ventricle has moderately decreased function. The left ventricle  demonstrates global hypokinesis. Left ventricular diastolic parameters are  indeterminate. There is the  interventricular septum is flattened in diastole ('D' shaped left  ventricle), consistent with right ventricular volume overload.  2. Right ventricular systolic function is severely reduced. The right  ventricular size is severely enlarged. There is normal pulmonary artery  systolic pressure. The estimated right ventricular systolic pressure is  33.3 mmHg.  3. Right atrial size was Massively dilated.  4. The mitral valve is normal in structure. Mild mitral valve  regurgitation. No evidence of mitral stenosis.  5. Ebstein's anomaly with tethering of the septal leaflet and posterior  leaflets to the atrialized RV. S/P TV repair (flexible annuloplasty versus  DeVega annuloplasty?). There is a mobile density measuring  2.4 x 1.78 cm  probably post-surgical  change/scarring (but cannot exclude vegetation) . The tricuspid valve is  abnormal. Tricuspid valve regurgitation is severe.  6. The aortic valve is normal in structure. Aortic valve regurgitation is  not  visualized. No aortic stenosis is present.  7. The inferior vena cava is dilated in size with <50% respiratory  variability, suggesting right atrial pressure of 15 mmHg.    TEE 9/21  1. Left ventricular ejection fraction, by estimation, is 35 to 40%. The  left ventricle has moderately decreased function.  2. Right ventricular systolic function is severely reduced. The right  ventricular size is severely enlarged.  3. No left atrial/left atrial appendage thrombus was detected.  4. Right atrial size was severely dilated.  5. The mitral valve is normal in structure. Mild to moderate mitral valve  regurgitation.  6. H/o Ebstein's abnormality. The septal leaflet appears  restricted/sutured. The posterior leaflet appears to have redundant chords  without flail. The leaflets do not coapt. There is wide-open TR.. The  tricuspid valve is has been repaired/replaced.  Tricuspid valve regurgitation is severe.  7. The aortic valve is normal in structure. Aortic valve regurgitation is  not visualized.   Review of systems complete and found to be negative unless listed in HPI.      Past Medical History:  Diagnosis Date  . Congenital heart disease    Epstein anomaly s/p repair 2002    Current Outpatient Medications  Medication Sig Dispense Refill  . amiodarone (PACERONE) 200 MG tablet Take 1 tablet (200 mg total) by mouth daily. 30 tablet 6  . apixaban (ELIQUIS) 5 MG TABS tablet Take 1 tablet (5 mg total) by mouth 2 (two) times daily. 60 tablet 6  . digoxin (LANOXIN) 0.125 MG tablet Take 1 tablet (0.125 mg total) by mouth daily. 30 tablet 6   No current facility-administered medications for this encounter.    No Known Allergies    Social History   Socioeconomic History  . Marital status: Single    Spouse name: Not on file  . Number of children: Not on file  . Years of education: Not on file  . Highest education level: Not on file  Occupational History  . Occupation:  [employer] for [employer]  Tobacco Use  . Smoking status: Current Every Day Smoker    Packs/day: 1.00    Start date: 2005  . Smokeless tobacco: Never Used  Substance and Sexual Activity  . 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 Drug use: Not Currently    Comment: denies  . Sexual activity: Not on file  Other Topics Concern  . Not on file  Social History Narrative  . Not on file   Social Determinants of Health   Financial Resource Strain:   . Difficulty of Paying Living Expenses: Not on file  Food Insecurity:   . Worried About Programme researcher, broadcasting/film/video in the Last Year: Not on file  . Ran Out of Food in the Last Year: Not on file  Transportation Needs:   . Lack of Transportation (Medical): Not on file  . Lack of Transportation (Non-Medical): Not on file  Physical Activity:   . Days of Exercise per Week: Not on file  . Minutes of Exercise per Session: Not on file  Stress:   . Feeling of Stress : Not on file  Social Connections:   .  Frequency of Communication with Friends and Family: Not on file  . Frequency of Social Gatherings with Friends and Family: Not on file  . Attends Religious Services: Not on file  . Active Member of Clubs or Organizations: Not on file  . Attends Banker Meetings: Not on file  . Marital Status: Not on file  Intimate Partner Violence:   . Fear of Current or Ex-Partner: Not on file  . Emotionally Abused: Not on file  . Physically Abused: Not on file  . Sexually Abused: Not on file     No family history on file.  Vitals:   02/20/20 1213  BP: 120/65  Pulse: 87  SpO2: 97%  Weight: 64 kg (141 lb)     PHYSICAL EXAM: General:  Well appearing, thin young male. No respiratory difficulty HEENT: normal Neck: supple. JVD elevated to jaw. Carotids 2+ bilat; no bruits. No lymphadenopathy or thyromegaly appreciated. Cor: PMI nondisplaced. Regular rate & rhythm.  No rubs, gallops or murmurs. Lungs: clear Abdomen: soft, nontender, nondistended. No hepatosplenomegaly. No bruits or masses. Good bowel sounds. Extremities: no cyanosis, clubbing, rash, edema Neuro: alert & oriented x 3, cranial nerves grossly intact. moves all 4 extremities w/o difficulty. Affect pleasant.  ECG: NSR w/ 1st degree AVB, 78 bpm, QT/QTc 474/540 ms    ASSESSMENT & PLAN:  1. 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 Ebstein anomaly s/p repair with severe TR and to-fro flow in hepatic veins - baseline RV function and degree of TR unclear but suspect decompensation related to AFL  - Had TEE showing EF 35-40%, severely reduced RV, Severe TR - required milrinone recent admit for RV support and to aid w/ diuresis. Able to wean off   - NYHA Class II - Fluid status increasing since discharge. Wt is up 10 lb  - Start Torsemide 20 mg daily. Aim to keep wt between 132-135 lb (141 lb today). He will monitor wt at home.  - Continue digoxin 0.125 mg. Check dig level today  - he did not tolerate losartan nor spiro given AKI and hyperkalemia. Repeat BMP today. 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 He remains in NSR. Plan repeat echo ~6-12 weeks  - Discussed case and TEE with structural heart team and CT surgery. Also d/w Dr. Deatra James at [facility]. Consensus seems to be that valve is not repairable and his best option may be eventual transplant. He is Blood Type A+  - RTC next week to reassess fluid status   2. Atrial flutter  - recent admit w/ AFL w/ RVR - converted to NSR on IV amio - Maintaining NSR on amio 200 daily. Will continue. Needs TSH/HFTs in 6-8 weeks  - Continue Eliquis 5 mg twice a day.  Check CBC today   3. Severe TR - Ebstein anomaly s/p repair with severe TR noted on most recent TEE 9/21 - Dr Gala Romney discussed case and TEE with structural heart team and CT surgery. Also d/w Dr. Deatra James at [facility]. Consensus seems to be that valve is not repairable and his best option may be eventual transplant - blood Type A+  4. Prolonged QT - 474/540 ms on EKG today  - continue amiodarone. He is not on any  other QT prolonging agents   5. Depression - currently not on antidepressants and would not be candidate for one with QT prolongation as well - appears to be in good spirits today  F/u next week w/ Dr. Gala RomneyBensimhon to reassess fluid status.     Robbie LisBrittainy Shamyra Farias, PA-C 02/20/20

## 2020-02-20 NOTE — Telephone Encounter (Signed)
Advanced Heart Failure Patient Advocate Encounter   Patient was approved to receive Eliquis from [facility].  Patient ID: [id number]  Effective dates: 02/19/20 through 02/18/21  Called patient, [facility] is shipping the medication to him.  Archer Asa, CPhT

## 2020-02-20 NOTE — Patient Instructions (Signed)
START Torsemide 20mg  Daily  Labs done today, your results will be available in MyChart, we will contact you for abnormal readings.  Please follow up with our physician in 1 week.  If you have any questions or concerns before your next appointment please send a message through [place] or call our office at [phone].    TO LEAVE A MESSAGE FOR THE NURSE SELECT OPTION 2, PLEASE LEAVE A MESSAGE INCLUDING: . YOUR NAME . DATE OF BIRTH . CALL BACK NUMBER . REASON FOR CALL**this is important as we prioritize the call backs  YOU WILL RECEIVE A CALL BACK THE SAME DAY AS LONG AS YOU CALL BEFORE 4:00 PM

## 2020-02-22 ENCOUNTER — Telehealth (HOSPITAL_COMMUNITY): Payer: Self-pay | Admitting: Internal Medicine

## 2020-02-23 ENCOUNTER — Telehealth (HOSPITAL_COMMUNITY): Payer: Self-pay | Admitting: Cardiology

## 2020-02-23 DIAGNOSIS — I5082 Biventricular heart failure: Secondary | ICD-10-CM

## 2020-02-23 MED ORDER — DIGOXIN 125 MCG PO TABS
0.0625 mg | ORAL_TABLET | Freq: Every day | ORAL | 6 refills | Status: DC
Start: 2020-02-23 — End: 2020-04-17

## 2020-02-23 NOTE — Telephone Encounter (Signed)
-----   Message from Allayne Butcher, [place] sent at 02/20/2020  5:26 PM EDT ----- Dig level is elevated. Hold dig x 2 days, then reduce down to 0.0625 mg daily. Repeat dig level next week at f/u visit. Will also need repeat CBC (hgb down slightly on Eliquis)

## 2020-02-23 NOTE — Telephone Encounter (Signed)
Pt aware and voiced understanding Lab orders updated

## 2020-02-29 ENCOUNTER — Encounter (HOSPITAL_COMMUNITY): Payer: Self-pay | Admitting: Internal Medicine

## 2020-03-13 ENCOUNTER — Ambulatory Visit: Payer: Self-pay | Attending: Family Medicine | Admitting: Family Medicine

## 2020-03-13 ENCOUNTER — Other Ambulatory Visit: Payer: Self-pay

## 2020-03-13 ENCOUNTER — Encounter: Payer: Self-pay | Admitting: Family Medicine

## 2020-03-13 VITALS — BP 105/65 | HR 66 | Ht 72.0 in | Wt 142.0 lb

## 2020-03-13 DIAGNOSIS — I4892 Unspecified atrial flutter: Secondary | ICD-10-CM

## 2020-03-13 DIAGNOSIS — I50813 Acute on chronic right heart failure: Secondary | ICD-10-CM

## 2020-03-13 DIAGNOSIS — Q249 Congenital malformation of heart, unspecified: Secondary | ICD-10-CM

## 2020-03-13 NOTE — Progress Notes (Signed)
Subjective:  Patient ID: Perry Cook, male    DOB: August 01, 1985  Age: 34 y.o. MRN: [id number]  CC: Hospitalization Follow-up   HPI Egidio Lofgren is a 34 year old male with a history of Ebstein's anomaly status post repair hospitalized from 02/06/2020 through 02/18/2020 for cardiogenic shock with congestive heart failure and atrial flutter with RVR. TEE revealed EF of 35 to 40%, moderately decreased left ventricular function, severely reduced RV systolic function, severely enlarged RV. During his hospital course he was treated with IV milrinone, amiodarone with conversion to normal sinus rhythm and also placed on anticoagulation.  He developed hyperkalemia and acute renal failure when treated with spironolactone and ARB.  Had a follow-up visit with the cardiology PA on 02/20/2020. States he feels great and would like to return to work.  He has no chest pain, pedal edema or dyspnea and his weight has been stable since his last cardiology visit. 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 Taken out of work 9/14 and was advised to be out of work till 03/25/20 by Dr Gala Romney.  He informed me he had asked for clearance to return to work with cardiology but was told only Dr. Gala Romney could release him and Dr. Gala Romney was not in the clinic that day. States he will get fired if he does not return to work.  Past Medical History:  Diagnosis Date  . Congenital heart disease    Epstein anomaly s/p repair 2002    Past Surgical History:  Procedure Laterality Date  . Epstein anomaly repair  06/2000  . MINIMALLY INVASIVE TRICUSPID VALVE REPAIR  2002  . TEE WITHOUT CARDIOVERSION N/A 02/12/2020   Procedure: TRANSESOPHAGEAL ECHOCARDIOGRAM (TEE);  Surgeon: Dolores Patty, MD;  Location: [facility] ENDOSCOPY;  Service: Cardiovascular;  Laterality: N/A;    History reviewed. No pertinent family history.  No Known Allergies  Outpatient Medications Prior to Visit  Medication Sig  Dispense Refill  . amiodarone (PACERONE) 200 MG tablet Take 1 tablet (200 mg total) by mouth daily. 30 tablet 6  . apixaban (ELIQUIS) 5 MG TABS tablet Take 1 tablet (5 mg total) by mouth 2 (two) times daily. 60 tablet 6  . digoxin (LANOXIN) 0.125 MG tablet Take 0.5 tablets (0.0625 mg total) by mouth daily. 15 tablet 6  . torsemide (DEMADEX) 20 MG tablet Take 1 tablet (20 mg total) by mouth daily. 30 tablet 3   No facility-administered medications prior to visit.     ROS Review of Systems  Constitutional: Negative for activity change and appetite change.  HENT: Negative for sinus pressure and sore throat.   Eyes: Negative for visual disturbance.  Respiratory: Negative for cough, chest tightness and shortness of breath.   Cardiovascular: Negative for chest pain and leg swelling.  Gastrointestinal: Negative for abdominal distention, abdominal pain, constipation and diarrhea.  Endocrine: Negative.   Genitourinary: Negative for dysuria.  Musculoskeletal: Negative for joint swelling and myalgias.  Skin: Negative for rash.  Allergic/Immunologic: Negative.   Neurological: Negative for weakness, light-headedness and numbness.  Psychiatric/Behavioral: Negative for dysphoric mood and suicidal ideas.    Objective:  BP 105/65   Pulse 66   Ht 6' (1.829 m)   Wt 142 lb (64.4 kg)   SpO2 97%   BMI 19.26 kg/m   BP/Weight 03/13/2020 02/20/2020 02/18/2020  Systolic BP 105 120 108  Diastolic BP 65 65 70  Wt. (Lbs) 142 141 131.61  BMI 19.26 19.12 -      Physical Exam Constitutional:  Appearance: He is well-developed.  Neck:     Vascular: No JVD.  Cardiovascular:     Rate and Rhythm: Normal rate.     Heart sounds: Normal heart sounds. No murmur heard.   Pulmonary:     Effort: Pulmonary effort is normal.     Breath sounds: Normal breath sounds. No wheezing or rales.  Chest:     Chest wall: No tenderness.  Abdominal:     General: Bowel sounds are normal. There is no distension.      Palpations: Abdomen is soft. There is no mass.     Tenderness: There is no abdominal tenderness.  Musculoskeletal:        General: Normal range of motion.     Right lower leg: No edema.     Left lower leg: No edema.  Neurological:     Mental Status: He is alert and oriented to person, place, and time.  Psychiatric:        Mood and Affect: Mood normal.     CMP Latest Ref Rng & Units 02/20/2020 02/18/2020 02/17/2020  Glucose 70 - 99 mg/dL 83 967(R) 89  BUN 6 - 20 mg/dL 9 15 17   Creatinine 0.61 - 1.24 mg/dL 9.16 3.84  Sodium 135 - 145 mmol/L 136 136 135  Potassium 3.5 - 5.1 mmol/L 4.2 4.4 5.5(H)  Chloride 98 - 111 mmol/L 104 102 103  CO2 22 - 32 mmol/L 23 26 25   Calcium 8.9 - 10.3 mg/dL 9.1 9.5 9.4  Total Protein 6.5 - 8.1 g/dL - - -  Total Bilirubin 0.3 - 1.2 mg/dL - - -  Alkaline Phos 38 - 126 U/L - - -  AST 15 - 41 U/L - - -  ALT 0 - 44 U/L - - -    Lipid Panel  No results found for: CHOL, TRIG, HDL, CHOLHDL, VLDL, LDLCALC, LDLDIRECT  CBC    Component Value Date/Time   WBC 3.5 (L) 02/20/2020 1237   RBC 4.17 (L) 02/20/2020 1237   HGB 11.9 (L) 02/20/2020 1237   HCT 37.0 (L) 02/20/2020 1237   PLT 218 02/20/2020 1237   MCV 88.7 02/20/2020 1237   MCH 28.5 02/20/2020 1237   MCHC 32.2 02/20/2020 1237   RDW 15.6 (H) 02/20/2020 1237   LYMPHSABS 0.8 02/06/2020 1025   MONOABS 0.8 02/06/2020 1025   EOSABS 0.0 02/06/2020 1025   BASOSABS 0.0 02/06/2020 1025    No results found for: HGBA1C  Assessment & Plan:  1. Acute on chronic right-sided heart failure (HCC) EF 35 to 40%, severe hypokinesis, severe TR, from echo of 01/2019 Euvolemic at this time Continue with torsemide, Digoxin Per cardiology notes his best option will be for cardiac transplant Advised he would need to obtain clearance to return to work from his cardiologist as I do not feel comfortable clearing him to return.  I have provided a letter for him to this effect.  2. Atrial Flutter (HCC) Now in sn  sinus rhythm Currently on amiodarone, Eliquis Avoid QT prolonging medications due to prolonged QT   3. Congenital heart disease Secondary to Ebstein's abnormality status post repair See #1 above. Follow-up with cardiology  No orders of the defined types were placed in this encounter.   Follow-up: Return in about 3 months (around 06/13/2020) for Coordination of care.       02/2019, MD, FAAFP. [facility] [place], [facility] [place]   03/13/2020, 4:18 PM

## 2020-03-13 NOTE — Progress Notes (Signed)
Patient has FMLA paperwork that needs to be filled out.  Need FMLA for time spent in the hospital.  Hospital doctor took him out of work until Nov. 1st

## 2020-03-21 ENCOUNTER — Telehealth (HOSPITAL_COMMUNITY): Payer: Self-pay

## 2020-03-21 NOTE — Telephone Encounter (Signed)
Received paperwork from [facility] department concerning patient returning to work 03/25/2020. Completed paperwork faxed to [phone].  Advised patient and sent copy to patient's address on file

## 2020-04-09 ENCOUNTER — Encounter (HOSPITAL_COMMUNITY): Payer: Self-pay | Admitting: Internal Medicine

## 2020-04-09 ENCOUNTER — Other Ambulatory Visit: Payer: Self-pay

## 2020-04-09 ENCOUNTER — Ambulatory Visit (HOSPITAL_COMMUNITY)
Admission: RE | Admit: 2020-04-09 | Discharge: 2020-04-09 | Disposition: A | Discharge status: Home / Self Care | Payer: Self-pay | Source: Ambulatory Visit | Attending: Internal Medicine | Admitting: Internal Medicine

## 2020-04-09 DIAGNOSIS — I480 Paroxysmal atrial fibrillation: Secondary | ICD-10-CM

## 2020-04-09 DIAGNOSIS — I071 Rheumatic tricuspid insufficiency: Secondary | ICD-10-CM

## 2020-04-09 DIAGNOSIS — I5082 Biventricular heart failure: Secondary | ICD-10-CM

## 2020-04-09 NOTE — Progress Notes (Signed)
Heart Failure TeleHealth Note  Due to national recommendations of social distancing due to COVID 19, Audio/video telehealth visit is felt to be most appropriate for this patient at this time.  See MyChart message from today for patient consent regarding telehealth for [facility]. The patient was identified personally using two identifiers.   Date:  04/09/2020   ID:  Perry Cook, DOB [birth year], MRN [id number]  Location: Home  Provider location: [facility] Type of Visit: Established patient  PCP:  Patient, No Pcp Per  Cardiologist:  Thurmon Fair, MD Primary HF: Jerran Tappan  Chief Complaint: Heart Failure follow-up   History of Present Illness:  Perry Reeve Tagliaferrois a 34 year old malewith a hx of Ebstein's anomaly syndrome s/prepair 2011, recently admitted 9/21 for acute CHF>>cardiongenic shock in the setting of rapid atrial flutter.   He has a hx ofEbstein's anomaly syndromewith repair at age 16yo.He reports he was initially diagnosed after collapsing during football practice.He states he was previously treated for unknown arrhythmia in the postsurgical setting with digoxin and beta-blocker. He was only on medication for short period of time but unfortunately was lost to f/u.   Admitted in 9/21 with cardiogenic shock. EKG showedAFL withrate at 177 bpm. Initial co-ox 45%, started on milrinone. CVP in 20 range, started on IV Lasix. IV amio started and he converted to NSR. TTE showed LVEF 40% RV massively dilated and severely HK. Ebstein anomaly s/p repair with severe TR and to-fro flow in hepatic veins. 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 TEE confirmed severe TR. Case and TEE discussed w/ structural heart team and CT surgery. Also d/c Dr. Deatra James at [facility]. Consensus seems to be that valve is not repairable and his best option may be eventual transplant. Successfully weaned off of milrinone. He was  transitioned off IV heparin>> Eliquis. IV amio>>PO. Digoxin also added. He was started on losartan and spiro but both meds discontinued given development of hyperkalemia. Also had hyponatremia, treated w/ Tolvaptan. He was not sent home on loop diuretic.    Was seen in f/u 02/20/20. Weight up 10 pounds. Started torsemide 20 daily  He presents via Web designer for a telehealth visit today.  Says he is feeling ok. Went back to work at [employer] to [employer] last week. Yesterday felt tired and SOB briefly. Feels better today. Takes torsemide daily but about to run out. Denies palpitations. Not checking HR lately. Has not heard from [facility] Remains on Eliquis. Had a f/u visit with [facility] on 03/13/20 BP 108/70. No labs drawn   TEE 9/21  1. Left ventricular ejection fraction, by estimation, is 35 to 40%. The  left ventricle has moderately decreased function.  2. Right ventricular systolic function is severely reduced. The right  ventricular size is severely enlarged.  3. Right atrial size was severely dilated.  4. The mitral valve is normal in structure. Mild to moderate mitral valve  regurgitation.  5. H/o Ebstein's abnormality. The septal leaflet appears  restricted/sutured. The posterior leaflet appears to have redundant chords  without flail. The leaflets do not coapt. There is wide-open TR.   Perry Cook denies symptoms worrisome for COVID 19.   Past Medical History:  Diagnosis Date  . Congenital heart disease    Epstein anomaly s/p repair 2002   Past Surgical History:  Procedure Laterality Date  . Epstein anomaly repair  06/2000  . MINIMALLY INVASIVE TRICUSPID VALVE REPAIR  2002  .  TEE WITHOUT CARDIOVERSION N/A 02/12/2020   Procedure: TRANSESOPHAGEAL ECHOCARDIOGRAM (TEE);  Surgeon: Dolores Patty, MD;  Location: [facility];  Service: Cardiovascular;  Laterality: N/A;     Current Outpatient Medications  Medication Sig Dispense Refill  .  amiodarone (PACERONE) 200 MG tablet Take 1 tablet (200 mg total) by mouth daily. 30 tablet 6  . apixaban (ELIQUIS) 5 MG TABS tablet Take 1 tablet (5 mg total) by mouth 2 (two) times daily. 60 tablet 6  . digoxin (LANOXIN) 0.125 MG tablet Take 0.5 tablets (0.0625 mg total) by mouth daily. 15 tablet 6  . torsemide (DEMADEX) 20 MG tablet Take 1 tablet (20 mg total) by mouth daily. 30 tablet 3   No current facility-administered medications for this encounter.    Allergies:   Patient has no known allergies.   Social History:  The patient  reports that he has been smoking. He started smoking about 16 years ago. He has been smoking about 1.00 pack per day. He has never used smokeless tobacco. He reports current alcohol use. He reports previous drug use.   Family History:  The patient's family history is not on file.   ROS:  Please see the history of present illness.   All other systems are personally reviewed and negative.   Exam:  (Video/Tele Health Call; Exam is subjective and or/visual.) General:  Speaks in full sentences. No resp difficulty. Lungs: Normal respiratory effort with conversation.  Abdomen: Non-distended per patient report Extremities: Pt denies edema. Neuro: Alert & oriented x 3.   Recent Labs: 02/07/2020: B Natriuretic Peptide 862.2; TSH 8.762 02/12/2020: Magnesium 2.2 02/14/2020: ALT 913 02/20/2020: BUN 9; Creatinine, Ser 0.85; Hemoglobin 11.9; Platelets 218; Potassium 4.2; Sodium 136  Personally reviewed   Wt Readings from Last 3 Encounters:  03/13/20 64.4 kg (142 lb)  02/20/20 64 kg (141 lb)  02/18/20 59.7 kg (131 lb 9.8 oz)      ASSESSMENT AND PLAN:  1. 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 Ebstein anomaly s/p repair with severe TR and to-fro flow in hepatic  veins - Had TEE showing EF 35-40%, severely reduced RV, Severe TR - required milrinone recent admit for RV support and to aid w/ diuresis. Able to wean off   - NYHA Class II-III - Volume status seems stable by televisit - Will need RN visit next week for labs and ECG - Continue Torsemide 20 mg daily. Aim to keep wt between 132-135 lb - Continue digoxin 0.125 mg.  - he did not tolerate losartan nor spiro given AKI and hyperkalemia. Consider re-challenge in future pending renal function/K - LV dysfunction likely due to AFL (tachy CM) suspect will improve with NSR. Plan repeat echo ~6-12 weeks  - Discussed case and TEE with structural heart team and CT surgery. Also d/w Dr. Deatra James at [facility]. Consensus seems to be that valve is not repairable and his best option may be eventual transplant. He is Blood Type A+  - Will refer to Dr. Rodman Key - Stressed need to be more complain with f/u  2. Atrial flutter  - admit 9/21 w/ AFL w/ RVR - converted to NSR on IV amio - Maintaining NSR on amio 200 daily. Will continue. Needs labs next week  - Continue Eliquis 5 mg twice a day. Denies  bleeding   3. Severe TR - Ebstein anomaly s/p repair with severe TR noted on most recent TEE 9/21 - I discussed case and TEE with structural heart team and CT surgery. Also d/w Dr. Deatra James at [facility]. Consensus seems to be that valve is not repairable and his best option may be eventual transplant - blood Type A+ - plan as above  4. Prolonged QT - needs repeat ECG - continue amiodarone. He is not on any other QT prolonging agents    COVID screen The patient does not have any symptoms that suggest any further testing/ screening at this time.  Social distancing reinforced today.  Recommended follow-up:  As above  Relevant cardiac medications were reviewed at length with the patient today.   The patient does not have concerns regarding their medications at this time.   The following  changes were made today:  As above  Today, I have spent 10 minutes with the patient with telehealth technology discussing the above issues .    Signed, Arvilla Meres, MD  04/09/2020 4:00 PM  [facility] [address] [facility] [place] [address] [phone] (office) [phone] (fax)

## 2020-04-12 NOTE — Addendum Note (Signed)
Encounter addended by: Noralee Space, RN on: 04/12/2020 4:45 PM  Actions taken: Clinical Note Signed, Order list changed, Diagnosis association updated

## 2020-04-12 NOTE — Progress Notes (Signed)
Orders placed, referral faxed, appt sch   Please schedule RN visit for vitals, labs and ECG next Wednesday. (CBC, CMET and BNP)  2. F/u clinic visit in 2 months  3. Please refer to Dr. Delrae Sawyers at [facility] program (has office in our [address])

## 2020-04-17 ENCOUNTER — Encounter (HOSPITAL_COMMUNITY): Payer: Self-pay | Admitting: *Deleted

## 2020-04-17 ENCOUNTER — Ambulatory Visit (HOSPITAL_COMMUNITY)
Admission: RE | Admit: 2020-04-17 | Discharge: 2020-04-17 | Disposition: A | Discharge status: Home / Self Care | Payer: Self-pay | Source: Ambulatory Visit | Attending: Internal Medicine | Admitting: Internal Medicine

## 2020-04-17 ENCOUNTER — Other Ambulatory Visit: Payer: Self-pay

## 2020-04-17 ENCOUNTER — Encounter (HOSPITAL_COMMUNITY): Payer: Self-pay

## 2020-04-17 VITALS — BP 120/82 | HR 78 | Wt 153.4 lb

## 2020-04-17 DIAGNOSIS — Q249 Congenital malformation of heart, unspecified: Secondary | ICD-10-CM

## 2020-04-17 DIAGNOSIS — I50812 Chronic right heart failure: Secondary | ICD-10-CM | POA: Insufficient documentation

## 2020-04-17 LAB — COMPREHENSIVE METABOLIC PANEL
ALT: 33 U/L (ref 0–44)
AST: 27 U/L (ref 15–41)
Albumin: 3.9 g/dL (ref 3.5–5.0)
Alkaline Phosphatase: 51 U/L (ref 38–126)
Anion gap: 8 (ref 5–15)
BUN: 10 mg/dL (ref 6–20)
CO2: 27 mmol/L (ref 22–32)
Calcium: 9.3 mg/dL (ref 8.9–10.3)
Chloride: 101 mmol/L (ref 98–111)
Creatinine, Ser: 0.79 mg/dL (ref 0.61–1.24)
GFR, Estimated: >60 mL/min (ref >60)
Glucose, Bld: 86 mg/dL (ref 70–99)
Potassium: 4 mmol/L (ref 3.5–5.1)
Sodium: 136 mmol/L (ref 135–145)
Total Bilirubin: 0.7 mg/dL (ref 0.3–1.2)
Total Protein: 7.4 g/dL (ref 6.5–8.1)

## 2020-04-17 LAB — CBC
HCT: 40.8 % (ref 39.0–52.0)
Hemoglobin: 12.9 g/dL — ABNORMAL LOW (ref 13.0–17.0)
MCH: 28.2 pg (ref 26.0–34.0)
MCHC: 31.6 g/dL (ref 30.0–36.0)
MCV: 89.1 fL (ref 80.0–100.0)
Platelets: 243 10*3/uL (ref 150–400)
RBC: 4.58 MIL/uL (ref 4.22–5.81)
RDW: 18.7 % — ABNORMAL HIGH (ref 11.5–15.5)
WBC: 3.7 10*3/uL — ABNORMAL LOW (ref 4.0–10.5)
nRBC: 0 % (ref 0.0–0.2)

## 2020-04-17 LAB — BRAIN NATRIURETIC PEPTIDE: B Natriuretic Peptide: 138.3 pg/mL — ABNORMAL HIGH (ref 0.0–100.0)

## 2020-04-17 MED ORDER — AMIODARONE HCL 200 MG PO TABS: 200.0000 mg | ORAL_TABLET | Freq: Every day | ORAL | 11 refills | Status: AC

## 2020-04-17 MED ORDER — DIGOXIN 125 MCG PO TABS: 0.0625 mg | ORAL_TABLET | Freq: Every day | ORAL | 11 refills | Status: AC

## 2020-04-17 MED ORDER — TORSEMIDE 20 MG PO TABS: 20.0000 mg | ORAL_TABLET | Freq: Every day | ORAL | 11 refills | Status: AC

## 2020-04-17 MED FILL — TORSEMIDE 20 MG TABLET: 20 | 30 days supply | Qty: 30 | Fill #0

## 2020-04-17 MED FILL — AMIODARONE HCL 200 MG TAB: 200 | 30 days supply | Qty: 30 | Fill #0

## 2020-04-17 MED FILL — DIGOXIN 0.125 MG TABLET: 125 | 30 days supply | Qty: 15 | Fill #0

## 2020-04-17 NOTE — Progress Notes (Signed)
Pt records and referral faxed to Dr Orie Rout office at [phone]

## 2020-06-13 ENCOUNTER — Ambulatory Visit: Payer: Self-pay | Admitting: Family Medicine

## 2020-08-12 ENCOUNTER — Ambulatory Visit: Payer: Self-pay | Attending: Family Medicine | Admitting: Family Medicine

## 2020-08-12 ENCOUNTER — Other Ambulatory Visit: Payer: Self-pay

## 2021-01-17 ENCOUNTER — Other Ambulatory Visit (HOSPITAL_COMMUNITY): Payer: Self-pay

## 2021-01-17 MED ORDER — APIXABAN 5 MG PO TABS
5.0000 mg | ORAL_TABLET | Freq: Two times a day (BID) | ORAL | 0 refills | Status: DC
Start: 2021-01-17 — End: 2021-02-14

## 2021-02-14 ENCOUNTER — Telehealth (HOSPITAL_COMMUNITY): Payer: Self-pay | Admitting: Internal Medicine

## 2021-02-14 ENCOUNTER — Other Ambulatory Visit (HOSPITAL_COMMUNITY): Payer: Self-pay

## 2021-02-14 MED ORDER — APIXABAN 5 MG PO TABS: 5.0000 mg | ORAL_TABLET | Freq: Two times a day (BID) | ORAL | 0 refills | Status: AC

## 2021-05-07 ENCOUNTER — Encounter (HOSPITAL_COMMUNITY): Payer: Self-pay | Admitting: Internal Medicine

## 2021-06-30 IMAGING — US US ABDOMEN LIMITED
1 series · 14 of 25 positions shown · non-contrast
Comparison: CT abdomen and pelvis February 06, 2020

CLINICAL DATA: Right upper quadrant pain

EXAM:
ULTRASOUND ABDOMEN LIMITED RIGHT UPPER QUADRANT

[Series 1: us abdomen limited · 14 of 49 slices shown]
[im 1/49]
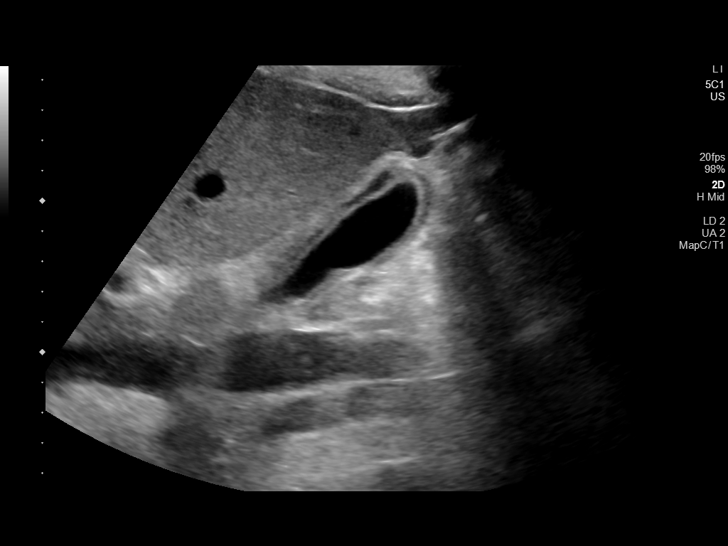
[im 5/49]
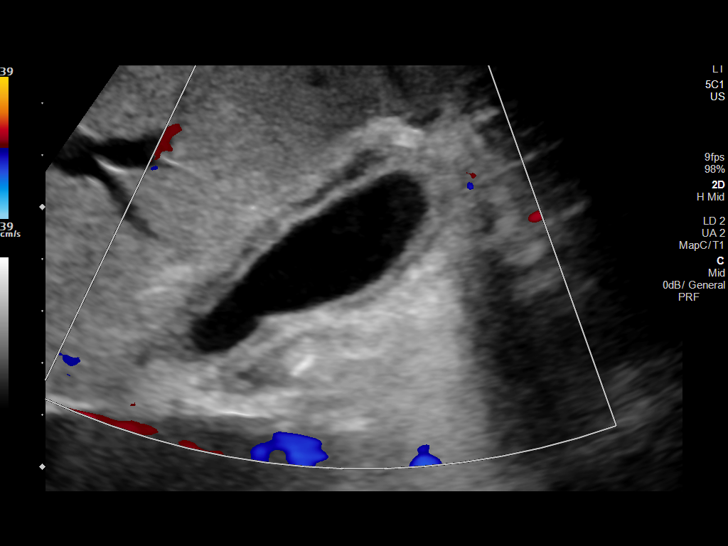
[im 9/49]
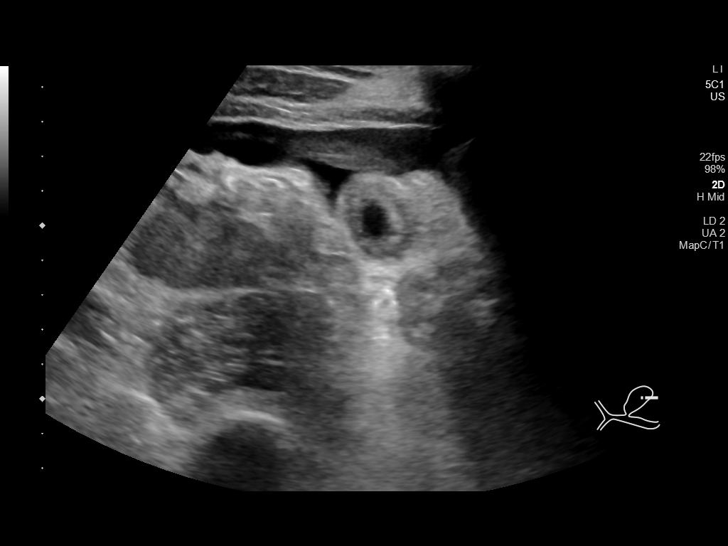
[im 13/49]
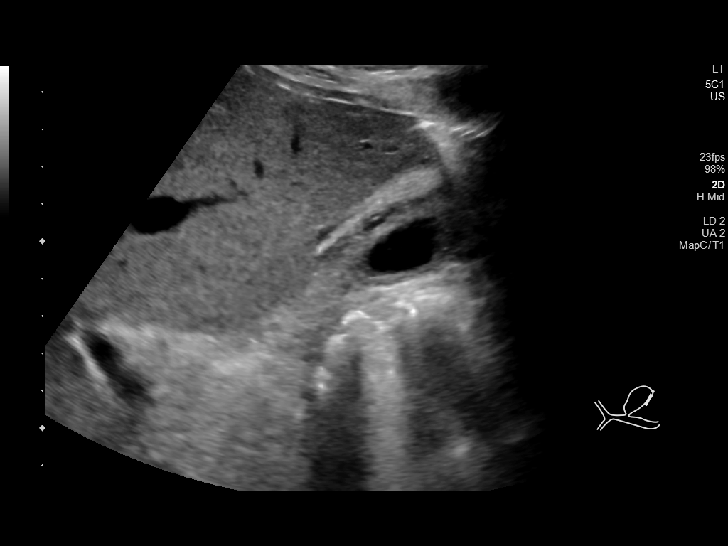
[im 17/49]
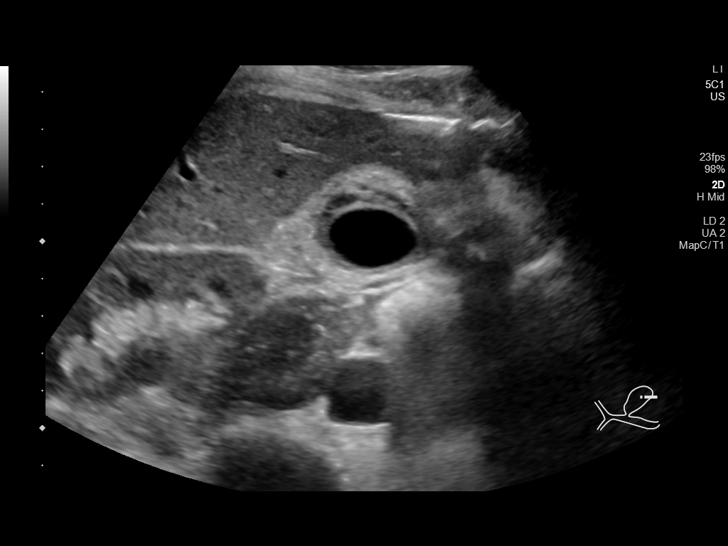
[im 19/49]
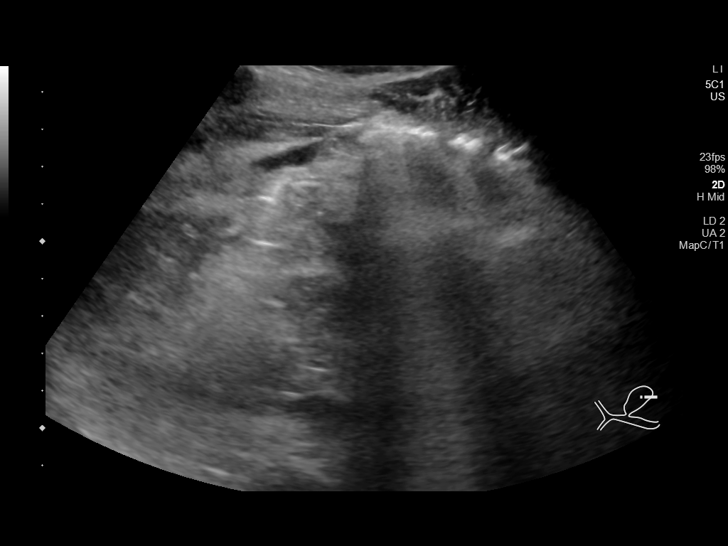
[im 23/49]
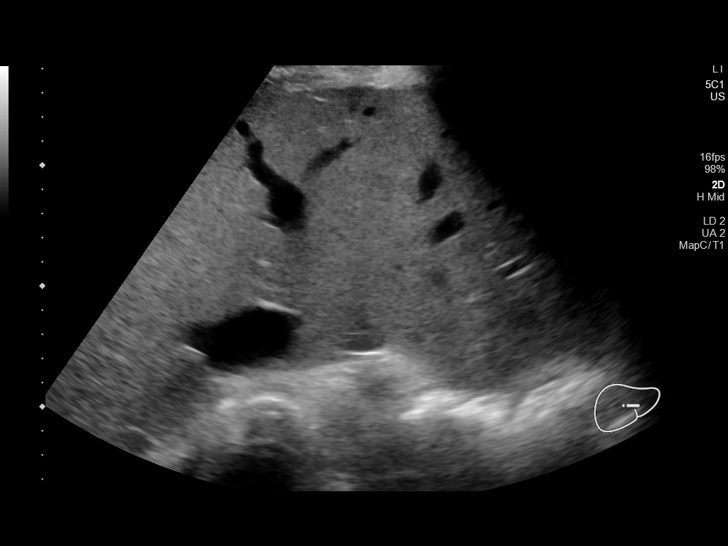
[im 27/49]
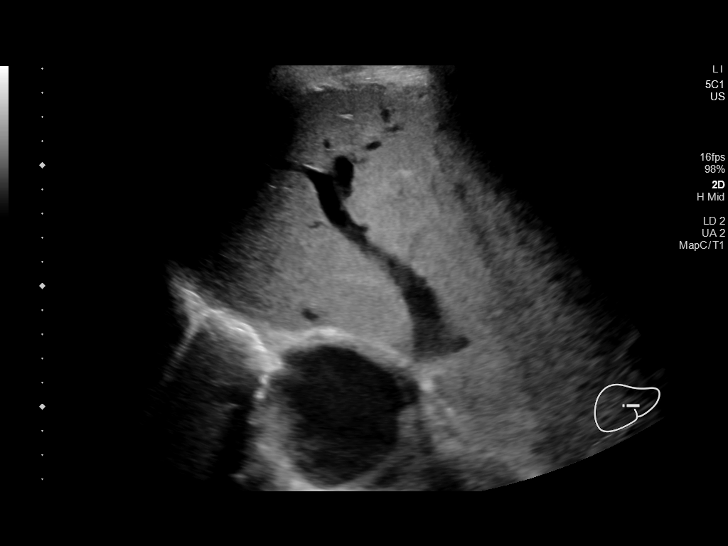
[im 31/49]
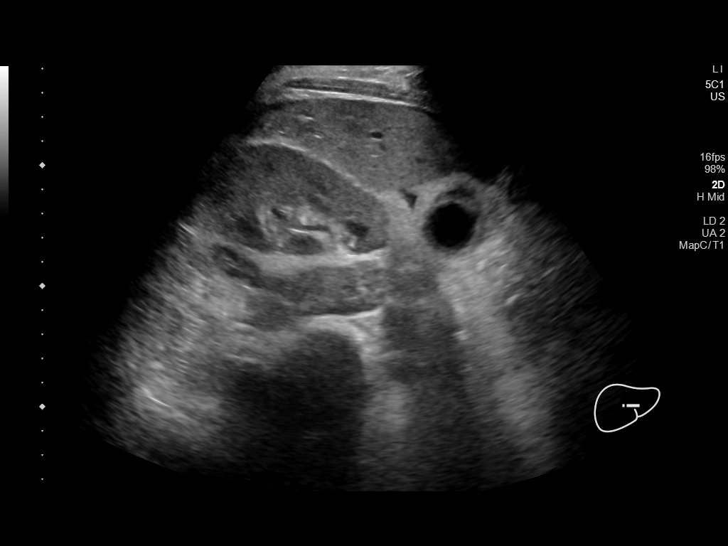
[im 33/49]
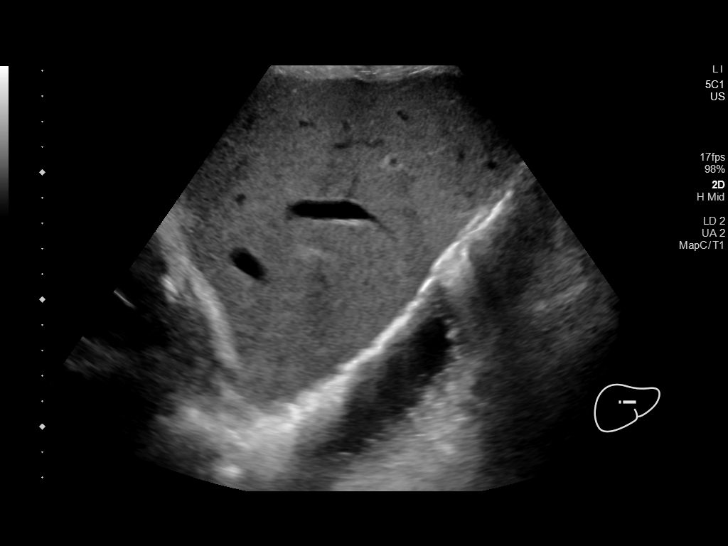
[im 37/49]
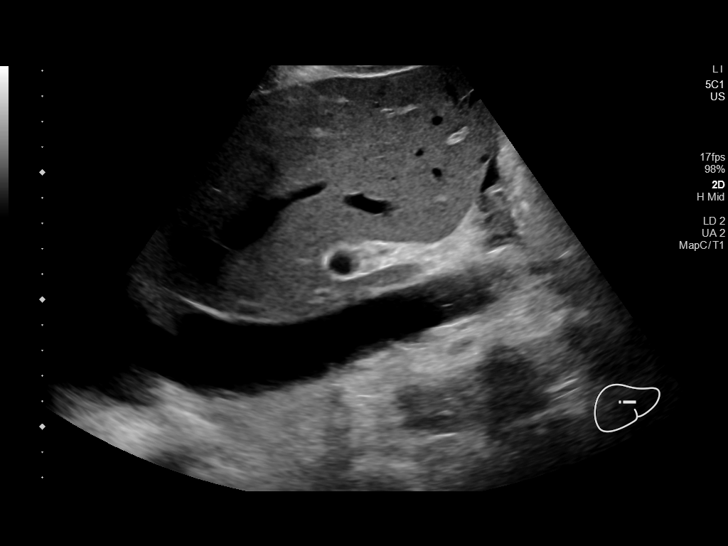
[im 41/49]
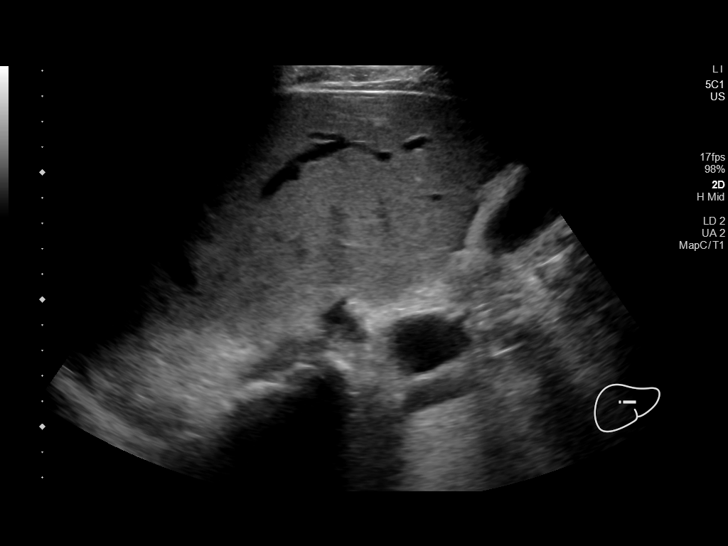
[im 45/49]
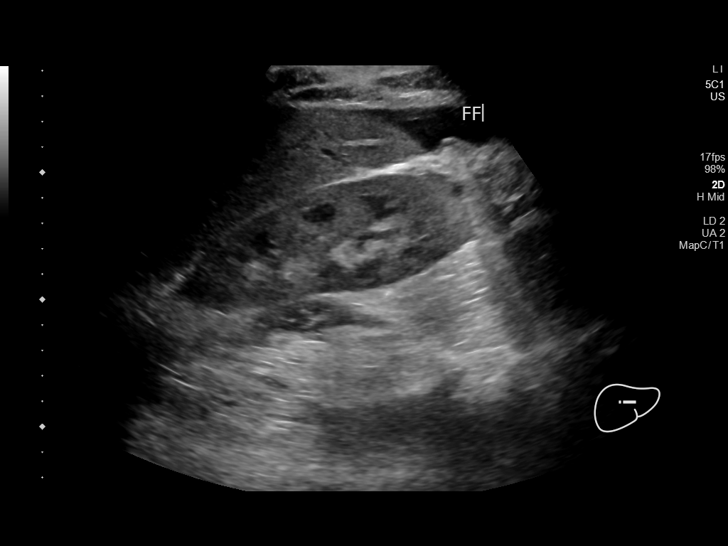
[im 49/49]
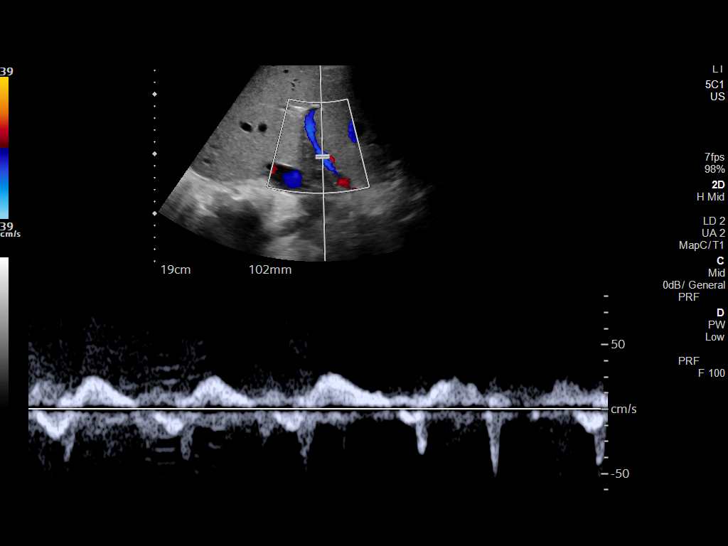

[14 of 25 positions shown; findings below may reference images not displayed]

FINDINGS: Gallbladder:

No gallstones are appreciable. The gallbladder wall is diffusely
thickened and edematous with mild pericholecystic fluid. No
sonographic Murphy sign noted by sonographer.

Common bile duct:

Diameter: 3 mm.

Liver:

No focal lesion identified. Within normal limits in parenchymal
echogenicity. Portal vein is patent on color Doppler imaging with
apparent bidirectional flow within the portal vein.

Other: There is mild ascites.
IMPRESSION: 1. No gallstones evident. Gallbladder wall thickening with edema and
pericholecystic fluid. While ascites may cause gallbladder wall
thickening, the overall appearance of the gallbladder is concerning
for acalculus cholecystitis. In this regard, it may be reasonable to
consider nuclear medicine hepatobiliary imaging study to assess for
cystic duct patency.

2.  Mild ascites.

3. Bidirectional flow in the portal vein. Portal vein appears
patent.

## 2021-07-02 IMAGING — DX DG CHEST 1V PORT
1 series · 1 of 1 positions shown · non-contrast
Comparison: 02/07/2020

CLINICAL DATA: Cardiogenic shock

EXAM:
PORTABLE CHEST 1 VIEW

[chest]
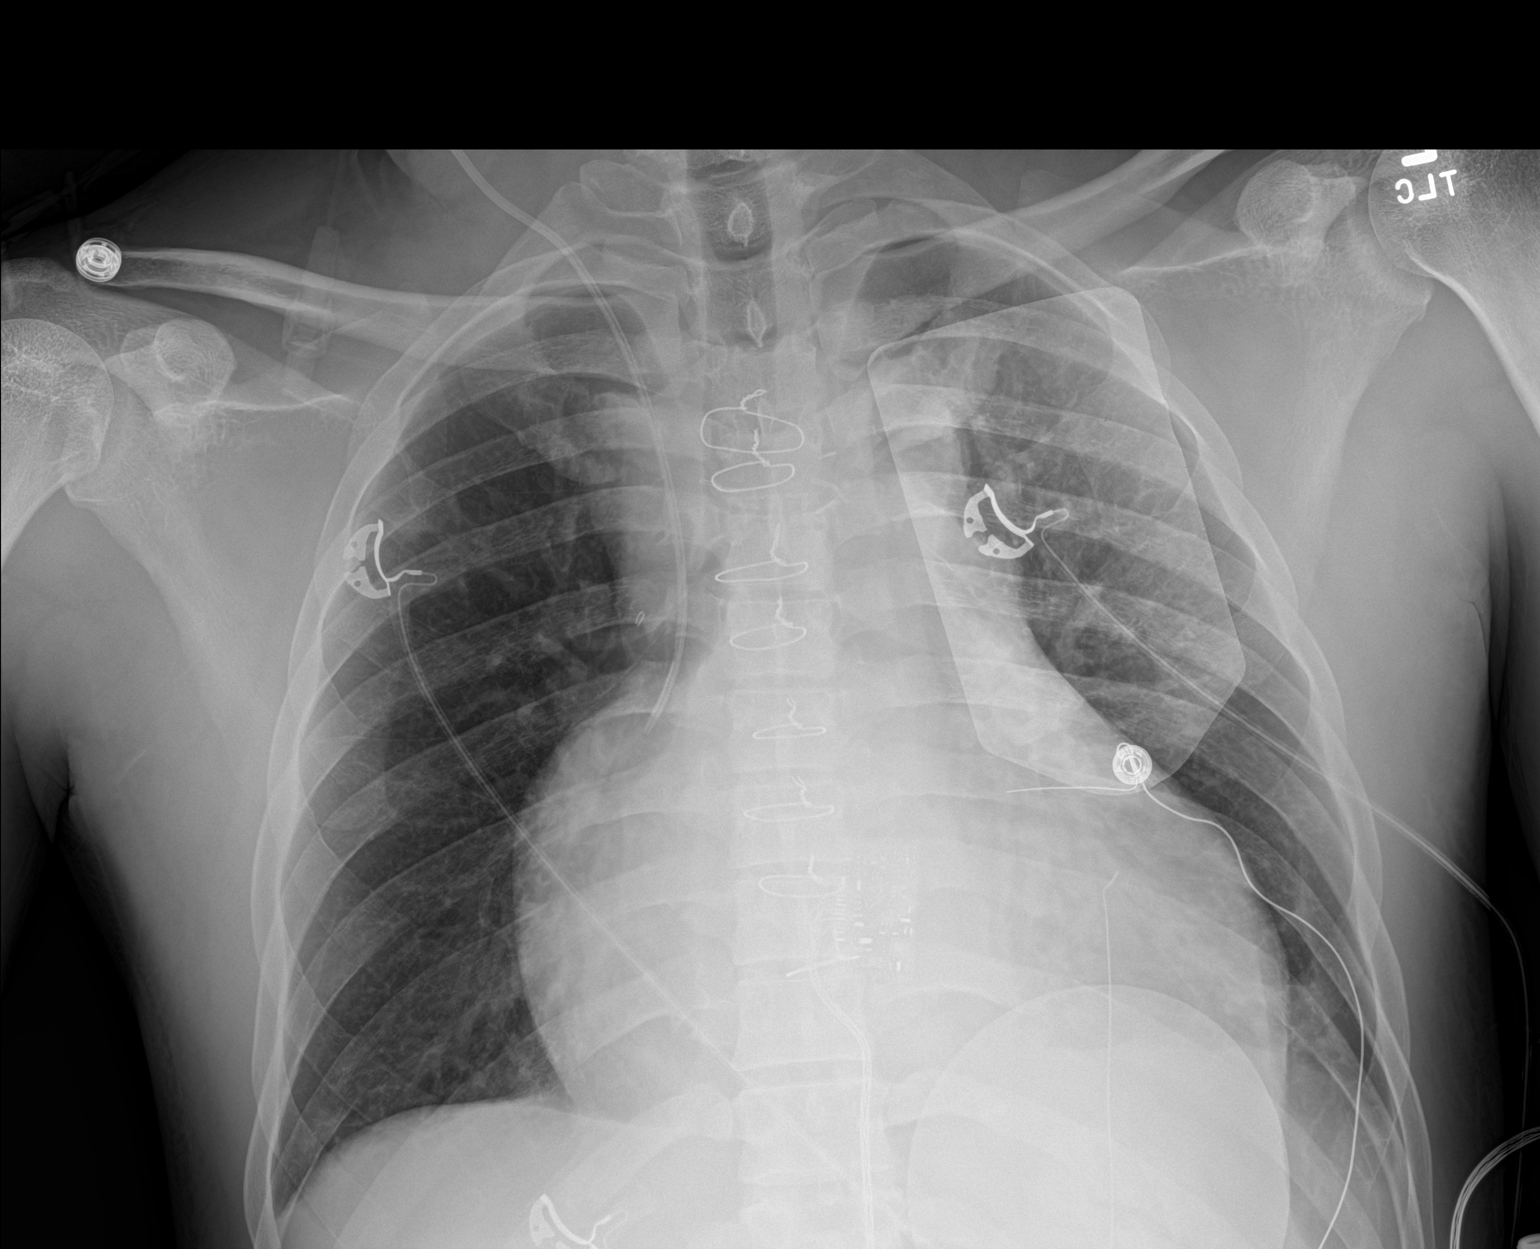

[1 of 1 positions shown; findings below may reference images not displayed]

FINDINGS: Prior median sternotomy. Cardiomegaly. Right central line tip at the
cavoatrial junction. No confluent opacities, effusions or edema. No
acute bony abnormality.
IMPRESSION: Cardiomegaly.  No active disease.
